# Patient Record
Sex: Male | Born: 1945 | Race: White | Hispanic: No | Marital: Married | Smoking: Former smoker
Health system: Southern US, Community
[De-identification: ages and names within clinical notes are randomized; demographics above are authoritative.]

## PROBLEM LIST (undated history)

## (undated) DIAGNOSIS — J069 Acute upper respiratory infection, unspecified: Secondary | ICD-10-CM

## (undated) DIAGNOSIS — K219 Gastro-esophageal reflux disease without esophagitis: Secondary | ICD-10-CM

## (undated) DIAGNOSIS — J45909 Unspecified asthma, uncomplicated: Secondary | ICD-10-CM

## (undated) HISTORY — DX: Acute upper respiratory infection, unspecified: J06.9

## (undated) HISTORY — DX: Unspecified asthma, uncomplicated: J45.909

## (undated) HISTORY — DX: Gastro-esophageal reflux disease without esophagitis: K21.9

## (undated) HISTORY — PX: TONSILLECTOMY: SUR1361

---

## 2001-12-26 ENCOUNTER — Encounter: Payer: Self-pay | Admitting: Emergency Medicine

## 2001-12-26 ENCOUNTER — Emergency Department (HOSPITAL_COMMUNITY): Admission: EM | Admit: 2001-12-26 | Discharge: 2001-12-26 | Payer: Self-pay | Admitting: Emergency Medicine

## 2003-08-16 ENCOUNTER — Emergency Department (HOSPITAL_COMMUNITY): Admission: EM | Admit: 2003-08-16 | Discharge: 2003-08-16 | Payer: Self-pay | Admitting: Emergency Medicine

## 2004-09-11 ENCOUNTER — Ambulatory Visit: Payer: Self-pay | Admitting: Internal Medicine

## 2005-01-29 ENCOUNTER — Ambulatory Visit: Payer: Self-pay | Admitting: Internal Medicine

## 2005-06-28 ENCOUNTER — Ambulatory Visit: Payer: Self-pay | Admitting: Internal Medicine

## 2006-02-06 ENCOUNTER — Ambulatory Visit: Payer: Self-pay | Admitting: Internal Medicine

## 2006-03-12 ENCOUNTER — Ambulatory Visit: Payer: Self-pay | Admitting: Internal Medicine

## 2006-05-11 ENCOUNTER — Emergency Department (HOSPITAL_COMMUNITY): Admission: EM | Admit: 2006-05-11 | Discharge: 2006-05-11 | Payer: Self-pay | Admitting: Emergency Medicine

## 2006-09-01 DIAGNOSIS — R05 Cough: Secondary | ICD-10-CM

## 2006-09-04 ENCOUNTER — Ambulatory Visit: Payer: Self-pay | Admitting: Family Medicine

## 2006-09-10 ENCOUNTER — Ambulatory Visit: Payer: Self-pay | Admitting: Internal Medicine

## 2006-09-10 DIAGNOSIS — J209 Acute bronchitis, unspecified: Secondary | ICD-10-CM

## 2006-09-19 ENCOUNTER — Telehealth: Payer: Self-pay | Admitting: Internal Medicine

## 2006-11-14 ENCOUNTER — Ambulatory Visit: Payer: Self-pay | Admitting: Internal Medicine

## 2006-11-14 DIAGNOSIS — M545 Low back pain: Secondary | ICD-10-CM

## 2006-11-14 DIAGNOSIS — J4489 Other specified chronic obstructive pulmonary disease: Secondary | ICD-10-CM | POA: Insufficient documentation

## 2006-11-14 DIAGNOSIS — M1990 Unspecified osteoarthritis, unspecified site: Secondary | ICD-10-CM | POA: Insufficient documentation

## 2006-11-14 DIAGNOSIS — J449 Chronic obstructive pulmonary disease, unspecified: Secondary | ICD-10-CM | POA: Insufficient documentation

## 2006-11-14 DIAGNOSIS — M542 Cervicalgia: Secondary | ICD-10-CM | POA: Insufficient documentation

## 2007-04-01 ENCOUNTER — Ambulatory Visit: Payer: Self-pay | Admitting: Family Medicine

## 2007-06-12 ENCOUNTER — Telehealth: Payer: Self-pay | Admitting: Internal Medicine

## 2007-06-13 ENCOUNTER — Telehealth: Payer: Self-pay | Admitting: Internal Medicine

## 2008-01-29 ENCOUNTER — Telehealth: Payer: Self-pay | Admitting: Internal Medicine

## 2013-09-11 ENCOUNTER — Encounter: Payer: Self-pay | Admitting: *Deleted

## 2015-05-24 LAB — PSA: PSA: 6.93

## 2015-12-06 ENCOUNTER — Other Ambulatory Visit: Payer: Self-pay | Admitting: Family Medicine

## 2015-12-06 ENCOUNTER — Ambulatory Visit
Admission: RE | Admit: 2015-12-06 | Discharge: 2015-12-06 | Disposition: A | Discharge status: Home / Self Care | Payer: Medicare Other | Source: Ambulatory Visit | Attending: Family Medicine | Admitting: Family Medicine

## 2015-12-06 DIAGNOSIS — M79605 Pain in left leg: Secondary | ICD-10-CM

## 2015-12-12 ENCOUNTER — Other Ambulatory Visit: Payer: Self-pay | Admitting: Family Medicine

## 2015-12-12 DIAGNOSIS — M79605 Pain in left leg: Secondary | ICD-10-CM

## 2015-12-12 DIAGNOSIS — T1590XA Foreign body on external eye, part unspecified, unspecified eye, initial encounter: Secondary | ICD-10-CM

## 2016-03-27 ENCOUNTER — Emergency Department (HOSPITAL_COMMUNITY): Payer: Medicare Other

## 2016-03-27 ENCOUNTER — Emergency Department (HOSPITAL_COMMUNITY)
Admission: EM | Admit: 2016-03-27 | Discharge: 2016-03-27 | Disposition: A | Discharge status: Home / Self Care | Payer: Medicare Other | Attending: Emergency Medicine | Admitting: Emergency Medicine

## 2016-03-27 ENCOUNTER — Encounter (HOSPITAL_COMMUNITY): Payer: Self-pay

## 2016-03-27 DIAGNOSIS — M79605 Pain in left leg: Secondary | ICD-10-CM | POA: Diagnosis present

## 2016-03-27 DIAGNOSIS — J449 Chronic obstructive pulmonary disease, unspecified: Secondary | ICD-10-CM | POA: Diagnosis not present

## 2016-03-27 DIAGNOSIS — Z87891 Personal history of nicotine dependence: Secondary | ICD-10-CM | POA: Insufficient documentation

## 2016-03-27 DIAGNOSIS — Z79899 Other long term (current) drug therapy: Secondary | ICD-10-CM | POA: Diagnosis not present

## 2016-03-27 DIAGNOSIS — M5432 Sciatica, left side: Secondary | ICD-10-CM | POA: Diagnosis not present

## 2016-03-27 MED ORDER — TAMSULOSIN HCL 0.4 MG PO CAPS: 0.4000 mg | ORAL_CAPSULE | Freq: Every day | ORAL | 0 refills | Status: AC

## 2016-03-27 MED ORDER — HYDROCODONE-ACETAMINOPHEN 5-325 MG PO TABS: 1.0000 | ORAL_TABLET | Freq: Four times a day (QID) | ORAL | 0 refills | Status: DC | PRN

## 2016-03-27 MED ORDER — HYDROCODONE-ACETAMINOPHEN 5-325 MG PO TABS
2.0000 | ORAL_TABLET | Freq: Once | ORAL | Status: AC
  Administered 2016-03-27: 2 via ORAL
  Filled 2016-03-27: qty 2

## 2016-03-27 MED ORDER — CYCLOBENZAPRINE HCL 10 MG PO TABS: 10.0000 mg | ORAL_TABLET | Freq: Three times a day (TID) | ORAL | 0 refills | Status: DC | PRN

## 2016-03-27 MED ORDER — CYCLOBENZAPRINE HCL 10 MG PO TABS
10.0000 mg | ORAL_TABLET | Freq: Once | ORAL | Status: AC
Start: 2016-03-27 — End: 2016-03-27
  Administered 2016-03-27: 10 mg via ORAL
  Filled 2016-03-27: qty 1

## 2016-03-27 MED ORDER — KETOROLAC TROMETHAMINE 60 MG/2ML IM SOLN
60.0000 mg | Freq: Once | INTRAMUSCULAR | Status: AC
  Administered 2016-03-27: 60 mg via INTRAMUSCULAR
  Filled 2016-03-27: qty 2

## 2016-03-27 MED ORDER — METHYLPREDNISOLONE 4 MG PO TBPK: ORAL_TABLET | ORAL | 0 refills | Status: DC

## 2016-03-27 NOTE — ED Triage Notes (Signed)
Patient c/o left hip and left leg pain ax 2 months. Patient c/o pain getting progressively worse. Patient denies any injury or swelling.

## 2016-03-27 NOTE — ED Provider Notes (Signed)
[facility]-EMERGENCY DEPT Provider Note   CSN: [id number] Arrival date & time: 03/27/16  1249     History   Chief Complaint Chief Complaint  Patient presents with  . Hip Pain  . Leg Pain    HPI Tyler Tanner is a 71 y.o. male.  HPI 71 year old male with past medical history of osteoarthritis here with left leg pain. The patient states that for the last several months, he has had progressively worsening aching, throbbing left gluteal and paraspinal pain. The pain occasionally increases a sharp, stabbing, stinging sensation that radiates down the back of his leg to the outside of his left foot. He has associated cramping of his muscles. No persistent numbness or weakness. No fevers or chills. No history of IV drug use. Denies any trauma. Has not had any loss of bowel or bladder function.  Past Medical History:  Diagnosis Date  . GERD (gastroesophageal reflux disease)     Patient Active Problem List   Diagnosis Date Noted  . COPD 11/14/2006  . OSTEOARTHRITIS 11/14/2006  . NECK PAIN 11/14/2006  . LOW BACK PAIN 11/14/2006  . 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 SYMPTOM, COUGH 09/01/2006    Past Surgical History:  Procedure Laterality Date  . TONSILLECTOMY         Home Medications    Prior to Admission medications   Medication Sig Start Date End Date Taking? Authorizing Provider  ibuprofen (ADVIL,MOTRIN) 800 MG tablet Take 800 mg by mouth every 8 (eight) hours as needed for moderate pain.  03/22/16  Yes Historical Provider, MD  cyclobenzaprine (FLEXERIL) 10 MG tablet Take 1 tablet (10 mg total) by mouth 3 (three) times daily as needed for muscle spasms. 03/27/16   Shaune Pollack, MD  HYDROcodone-acetaminophen (NORCO/VICODIN) 5-325 MG tablet Take 1-2 tablets by mouth every 6 (six) hours as needed for severe pain. 03/27/16   Shaune Pollack, MD  methylPREDNISolone (MEDROL DOSEPAK) 4 MG TBPK tablet Take as directed on pack 03/27/16   Shaune Pollack, MD  tamsulosin (FLOMAX) 0.4 MG CAPS  capsule Take 1 capsule (0.4 mg total) by mouth daily after supper. 03/27/16   Shaune Pollack, MD    Family History Family History  Problem Relation Age of Onset  . Diabetes Mother   . Coronary artery disease Brother     Social History Social History  Substance Use Topics  . Smoking status: Former Games developer  . Smokeless tobacco: Never Used  . Alcohol use No     Allergies   Patient has no known allergies.   Review of Systems Review of Systems  Constitutional: Negative for chills, fatigue and fever.  HENT: Negative for congestion and rhinorrhea.   Eyes: Negative for visual disturbance.  Respiratory: Negative for cough, shortness of breath and wheezing.   Cardiovascular: Negative for chest pain and leg swelling.  Gastrointestinal: Negative for abdominal pain, diarrhea, nausea and vomiting.  Genitourinary: Negative for dysuria and flank pain.  Musculoskeletal: Positive for back pain and gait problem (due to pain). Negative for neck pain and neck stiffness.  Skin: Negative for rash and wound.  Allergic/Immunologic: Negative for immunocompromised state.  Neurological: Positive for numbness (intermittent). Negative for syncope, weakness and headaches.  All other systems reviewed and are negative.    Physical Exam Updated Vital Signs BP (!) 146/96 (BP Location: Right Arm)   Pulse 73   Temp 98.4 F (36.9 C) (Oral)   Resp 16   Ht 5\' 6"  (1.676 m)   Wt 190 lb (86.2 kg)   SpO2 94%  BMI 30.67 kg/m   Physical Exam  Constitutional: He is oriented to person, place, and time. He appears well-developed and well-nourished. No distress.  HENT:  Head: Normocephalic and atraumatic.  Eyes: Conjunctivae are normal.  Neck: Neck supple.  Cardiovascular: Normal rate, regular rhythm and normal heart sounds.   Pulmonary/Chest: Effort normal. No respiratory distress. He has no wheezes.  Abdominal: He exhibits no distension.  Musculoskeletal: He exhibits no edema.  Neurological: He is  alert and oriented to person, place, and time. He exhibits normal muscle tone.  Skin: Skin is warm. Capillary refill takes less than 2 seconds. No rash noted.  Nursing note and vitals reviewed.   Spine Exam: Inspection/Palpation: Moderate TTP over left SI joint/paraspinal lumbar area, with no midline deformity or TTP. Positive straight leg test on left. Strength: 5/5 throughout LE bilaterally (hip flexion/extension, adduction/abduction; knee flexion/extension; foot dorsiflexion/plantarflexion, inversion/eversion; great toe inversion) Sensation: Intact to light touch in proximal and distal LE bilaterally Reflexes: 2+ quadriceps and achilles reflexes   ED Treatments / Results  Labs (all labs ordered are listed, but only abnormal results are displayed) Labs Reviewed - No data to display  EKG  EKG Interpretation None       Radiology Dg Lumbar Spine Complete  Result Date: 03/27/2016 CLINICAL DATA:  Low back pain. EXAM: LUMBAR SPINE - COMPLETE 4+ VIEW COMPARISON:  08/16/2003. FINDINGS: No acute bony abnormality identified. Normal alignment. Mild scoliosis. Mild degenerative change. IMPRESSION: Mild scoliosis lumbar spine. Mild diffuse degenerative change. No acute abnormality identified. Electronically Signed   By: Maisie Fus  Register   On: 03/27/2016 16:45    Procedures Procedures (including critical care time)  Medications Ordered in ED Medications  ketorolac (TORADOL) injection 60 mg (60 mg Intramuscular Given 03/27/16 1625)  HYDROcodone-acetaminophen (NORCO/VICODIN) 5-325 MG per tablet 2 tablet (2 tablets Oral Given 03/27/16 1625)  cyclobenzaprine (FLEXERIL) tablet 10 mg (10 mg Oral Given 03/27/16 1625)     Initial Impression / Assessment and Plan / ED Course  I have reviewed the triage vital signs and the nursing notes.  Pertinent labs & imaging results that were available during my care of the patient were reviewed by me and considered in my medical decision making (see chart  for details).   71 year old male with past mental history as above here with atraumatic left paraspinal pain radiating down his left leg. Exam is highly consistent with lumbar radiculopathy, specifically sciatica. His plain films are negative and he has no evidence of bony lesions. No recent trauma. No loss of bowel or bladder function or evidence to suggest cauda equina. He has no numbness or weakness on exam to suggest ongoing peripheral nerve damage. Will place him on a Medrol Dosepak, give analgesia, and outpatient follow-up. He does have a component of muscular spasm and will give him Flexeril. Of note, he has a history of BPH and was previously on Flomax and will give him 2 weeks of this while he is taking Flexeril to prevent urinary retention (as pt requested).   Final Clinical Impressions(s) / ED Diagnoses   Final diagnoses:  Sciatica of left side    New Prescriptions Discharge Medication List as of 03/27/2016  5:27 PM    START taking these medications   Details  cyclobenzaprine (FLEXERIL) 10 MG tablet Take 1 tablet (10 mg total) by mouth 3 (three) times daily as needed for muscle spasms., Starting Tue 03/27/2016, Print    HYDROcodone-acetaminophen (NORCO/VICODIN) 5-325 MG tablet Take 1-2 tablets by mouth every 6 (six) hours as  needed for severe pain., Starting Tue 03/27/2016, Print    methylPREDNISolone (MEDROL DOSEPAK) 4 MG TBPK tablet Take as directed on pack, Print    tamsulosin (FLOMAX) 0.4 MG CAPS capsule Take 1 capsule (0.4 mg total) by mouth daily after supper., Starting Tue 03/27/2016, Print         Shaune Pollackameron Zerrick Hanssen, MD 03/28/16 1316

## 2016-05-24 LAB — BASIC METABOLIC PANEL: Glucose: 86

## 2016-05-24 LAB — PSA: PSA: 5.9

## 2016-06-13 ENCOUNTER — Ambulatory Visit (INDEPENDENT_AMBULATORY_CARE_PROVIDER_SITE_OTHER): Payer: Medicare Other

## 2016-06-13 ENCOUNTER — Ambulatory Visit (INDEPENDENT_AMBULATORY_CARE_PROVIDER_SITE_OTHER): Payer: Medicare Other | Admitting: Physical Medicine and Rehabilitation

## 2016-06-13 ENCOUNTER — Encounter (INDEPENDENT_AMBULATORY_CARE_PROVIDER_SITE_OTHER): Payer: Self-pay | Admitting: Physical Medicine and Rehabilitation

## 2016-06-13 VITALS — BP 138/88

## 2016-06-13 DIAGNOSIS — G8929 Other chronic pain: Secondary | ICD-10-CM

## 2016-06-13 DIAGNOSIS — M7632 Iliotibial band syndrome, left leg: Secondary | ICD-10-CM | POA: Diagnosis not present

## 2016-06-13 DIAGNOSIS — M5416 Radiculopathy, lumbar region: Secondary | ICD-10-CM

## 2016-06-13 DIAGNOSIS — M25562 Pain in left knee: Secondary | ICD-10-CM | POA: Diagnosis not present

## 2016-06-13 NOTE — Progress Notes (Signed)
OTHER ATIENZA - 71 y.o. male MRN [id number]  Date of birth: 04-27-1945  Office Visit Note: Visit Date: 06/13/2016 PCP: Gordy Savers, MD Referred by: Gordy Savers, MD  Subjective: Chief Complaint  Patient presents with  . Lower Back - Pain   HPI: Mr. Hink is a 71 year old gentleman accompanied by his wife who provided some of the history. 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 He reports that around March he began having pain in the left low back that was shooting down the left leg. He has had back pain off and on before but nothing shooting down the leg like this. Does not do any specific injury. He did go to the emergency department at that point any received an injection of Toradol and likely Kenalog. He was given some Flexeril as well. He says the leg pain and back pain at that point actually stopped and the shot did extremely well for him. He is not having any real back or leg pain at this point. His biggest complaint coming in today is the fact that he saw Dr. Newell Coral at  [facility] who recommended surgery on his lumbar spine. He did have an MRI of the lumbar spine performed and this was requested by his primary care physician. This MRI is reviewed below and reviewed with the patient today. After the visit he really did not want surgery on his lumbar spine his biggest complaint at this point is really just lateral posterior left knee pain. He reports he gets really stiff with sitting and standing still in the knee. He denies any specific injury to the knee. He's never really known if she's had arthritis in his knees. He has not reported any swelling or locking or popping. He has not reported any strength loss. He has no numbness or tingling. He has no right-sided knee pain. Again the pain is really worse with him flexing the knee more than extending. He has the soreness feeling in the low back but really just not a big issue  at this point.    Review of Systems  Constitutional: Negative for chills, fever, malaise/fatigue and weight loss.  HENT: Negative for hearing loss and sinus pain.   Eyes: Negative for blurred vision, double vision and photophobia.  Respiratory: Negative for cough and shortness of breath.   Cardiovascular: Negative for chest pain, palpitations and leg swelling.  Gastrointestinal: Negative for abdominal pain, nausea and vomiting.  Genitourinary: Negative for flank pain.  Musculoskeletal: Positive for back pain and joint pain. Negative for myalgias.  Skin: Negative for itching and rash.  Neurological: Negative for tremors, focal weakness and weakness.  Endo/Heme/Allergies: Negative.   Psychiatric/Behavioral: Negative for depression.  All other systems reviewed and are negative.  Otherwise per HPI.  Assessment & Plan: Visit Diagnoses:  1. Lumbar radiculopathy   2. Chronic pain of left knee   3. It band syndrome, left     Plan: Findings:  Chronic worsening left knee pain with fairly benign the exam. 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 He has no swelling or other issues. I think at this point this is probably more of a combination of osteoporosis of the left knee as well as perhaps iliotibial band syndrome after the radiculitis radiculopathy episode. His lumbar spine MRI does show disc herniation at L5-S1 without compression. He also has some moderate stenosis at L3-4 which could also cause more of an L4 radicular pain.  But again there is no frank compression and no weakness on exam. I think the best approach right now is to continue with his ibuprofen and try to see if we can get him into physical therapy just for a couple of 3 sessions to look at the left knee and iliotibial band. If that doesn't seem to help them or if it's getting worse and I would have him seen by Dr. August Saucerean in our office to look at his knee. If his symptoms of radicular pain flareup and he can  obviously call us back and we'll look at. I don't think at this point he needs surgery at all.    Meds & Orders: No orders of the defined types were placed in this encounter.   Orders Placed This Encounter  Procedures  . XR Knee 1-2 Views Left  . Ambulatory referral to Physical Therapy    Follow-up: Return if symptoms worsen or fail to improve.   Procedures: No procedures performed  No notes on file   Clinical History: MRI LUMBAR SPINE WITHOUT CONTRAST  TECHNIQUE: Multiplanar, multisequence MR imaging of the lumbar spine was performed. No intravenous contrast was administered.  COMPARISON: None.  FINDINGS: Segmentation: Standard.  Alignment: Physiologic.  Vertebrae: No fracture, evidence of discitis, or bone lesion.  Conus medullaris: Extends to the T12 level and appears normal.  Paraspinal and other soft tissues: No paraspinal abnormality.  Disc levels:  Disc spaces: Degenerative disc disease with disc height loss at L5-S1.  T12-L1: No significant disc bulge. No evidence of neural foraminal stenosis. No central canal stenosis.  L1-L2: Mild broad-based disc bulge. No evidence of neural foraminal stenosis. No central canal stenosis.  L2-L3: Mild broad-based disc bulge. Mild bilateral facet arthropathy. No evidence of neural foraminal stenosis. No central canal stenosis.  L3-L4: Moderate broad-based disc bulge. Moderate bilateral facet arthropathy. Moderate spinal stenosis. No evidence of neural foraminal stenosis.  L4-L5: Mild broad-based disc bulge. Mild bilateral facet arthropathy. Mild bilateral lateral recess narrowing. No evidence of neural foraminal stenosis. No central canal stenosis.  L5-S1: Left paracentral disc protrusion with mass effect on the left intraspinal S1 nerve root. No evidence of neural foraminal stenosis. No central canal stenosis.  IMPRESSION: 1. At L5-S1 there is a left paracentral disc protrusion with mass effect on the left  intraspinal S1 nerve root. 2. At L4-5 there is a mild broad-based disc bulge with mild bilateral facet arthropathy. Mild bilateral lateral recess narrowing. 3. At L3-4 there is a moderate broad-based disc bulge. Moderate bilateral facet arthropathy. Moderate spinal stenosis.   Electronically Signed By: Elige KoHetal Patel On: 04/18/2016 10:58  He reports that he has quit smoking. He has never used smokeless tobacco. No results for input(s): HGBA1C, LABURIC in the last 8760 hours.  Objective:  VS:  HT:    WT:   BMI:     BP:138/88  HR: bpm  TEMP: ( )  RESP:  Physical Exam  Constitutional: He is oriented to person, place, and time. He appears well-developed and well-nourished. No distress.  HENT:  Head: Normocephalic and atraumatic.  Nose: Nose normal.  Mouth/Throat: Oropharynx is clear and moist.  Eyes: Conjunctivae are normal. Pupils are equal, round, and reactive to light.  Neck: Normal range of motion. Neck supple.  Cardiovascular: Regular rhythm and intact distal pulses.   Pulmonary/Chest: Effort normal and breath sounds normal.  Abdominal: Soft. He exhibits no distension.  Musculoskeletal: He exhibits no deformity.       Left knee: He exhibits no effusion.  Patient ambulates without aid with a fairly normal gait. He does have some pain with extension of the lumbar spine. He has no pain over the greater trochanters. He is tender along the insertion of the iliotibial band on the left as well as the hamstring area behind the knee. I do not appreciate any Baker's cyst. The exam falls below. He has good distal strength.  Neurological: He is alert and oriented to person, place, and time. No sensory deficit. Coordination normal.  Skin: Skin is warm. No rash noted.  Psychiatric: He has a normal mood and affect. His behavior is normal.  Nursing note and vitals reviewed.   Right Knee Exam  Right knee exam is normal.   Left Knee Exam   Range of Motion  The patient has normal left knee  ROM.  Muscle Strength   The patient has normal left knee strength.  Tests  McMurray:  Medial - negative Lateral - negative Lachman:  Anterior - negative     Varus: negative Valgus: negative Patellar Apprehension: negative  Other  Erythema: absent Scars: absent Sensation: normal Swelling: none Effusion: no effusion present     Imaging: Xr Knee 1-2 Views Left  Result Date: 06/14/2016 Symmetric bilateral medial joint line narrowing without effusion and no fractures or dislocations.   Past Medical/Family/Surgical/Social History: Medications & Allergies reviewed per EMR Patient Active Problem List   Diagnosis Date Noted  . COPD 11/14/2006  . OSTEOARTHRITIS 11/14/2006  . NECK PAIN 11/14/2006  . LOW BACK PAIN 11/14/2006  . 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 SYMPTOM, COUGH 09/01/2006   Past Medical History:  Diagnosis Date  . GERD (gastroesophageal reflux disease)    Family History  Problem Relation Age of Onset  . Diabetes Mother   . Coronary artery disease Brother    Past Surgical History:  Procedure Laterality Date  . TONSILLECTOMY     Social History   Occupational History  . Not on file.   Social History Main Topics  . Smoking status: Former Games developer  . Smokeless tobacco: Never Used  . Alcohol use No  . Drug use: No  . Sexual activity: Not on file

## 2016-06-13 NOTE — Progress Notes (Deleted)
Constant soreness behind left knee for several months. Walking helps. States it gets really stiff with sitting and standing still. Denies any low back pain or leg pain. Says when it first started which was around March pain was going down his leg. Had a shot in the ER and was put on Flexeril which has helped but still has the soreness feeling everyday but more prevalent at times. Saw a doctor at [facility] one time and didn't care for him. Unsure of his name but thinks it is Dr. Newell CoralNudelman.

## 2016-06-13 NOTE — Patient Instructions (Signed)
Iliotibial Band Syndrome Rehab  Ask your health care provider which exercises are safe for you. Do exercises exactly as told by your health care provider and adjust them as directed. It is normal to feel mild stretching, pulling, tightness, or discomfort as you do these exercises, but you should stop right away if you feel sudden pain or your pain gets worse. Do not begin these exercises until told by your health care provider.  Stretching and range of motion exercises  These exercises warm up your muscles and joints and improve the movement and flexibility of your hip and pelvis.  Exercise A: Quadriceps, prone    1. Lie on your abdomen on a firm surface, such as a bed or padded floor.  2. Bend your left / right knee and hold your ankle. If you cannot reach your ankle or pant leg, loop a belt around your foot and grab the belt instead.  3. Gently pull your heel toward your buttocks. Your knee should not slide out to the side. You should feel a stretch in the front of your thigh and knee.  4. Hold this position for __________ seconds.  Repeat __________ times. Complete this stretch __________ times a day.  Exercise B: Iliotibial band    1. Lie on your side with your left / right leg in the top position.  2. Bend both of your knees and grab your left / right ankle. Stretch out your bottom arm to help you balance.  3. Slowly bring your top knee back so your thigh goes behind your trunk.  4. Slowly lower your top leg toward the floor until you feel a gentle stretch on the outside of your left / right hip and thigh. If you do not feel a stretch and your knee will not fall farther, place the heel of your other foot on top of your knee and pull your knee down toward the floor with your foot.  5. Hold this position for __________ seconds.  Repeat __________ times. Complete this stretch __________ times a day.  Strengthening exercises  These exercises build strength and endurance in your hip and pelvis. Endurance is the  ability to use your muscles for a long time, even after they get tired.  Exercise C: Straight leg raises (  hip abductors)  1. Lie on your side with your left / right leg in the top position. Lie so your head, shoulder, knee, and hip line up. You may bend your bottom knee to help you balance.  2. Roll your hips slightly forward so your hips are stacked directly over each other and your left / right knee is facing forward.  3. Tense the muscles in your outer thigh and lift your top leg 4-6 inches (10-15 cm).  4. Hold this position for __________ seconds.  5. Slowly return to the starting position. Let your muscles relax completely before doing another repetition.  Repeat __________ times. Complete this exercise __________ times a day.  Exercise D: Straight leg raises (  hip extensors)  1. Lie on your abdomen on your bed or a firm surface. You can put a pillow under your hips if that is more comfortable.  2. Bend your left / right knee so your foot is straight up in the air.  3. Squeeze your buttock muscles and lift your left / right thigh off the bed. Do not let your back arch.  4. Tense this muscle as hard as you can without increasing any knee pain.    5. Hold this position for __________ seconds.  6. Slowly lower your leg to the starting position and allow it to relax completely.  Repeat __________ times. Complete this exercise __________ times a day.  Exercise E: Hip hike  1. Stand sideways on a bottom step. Stand on your left / right leg with your other foot unsupported next to the step. You can hold onto the railing or wall if needed for balance.  2. Keep your knees straight and your torso square. Then, lift your left / right hip up toward the ceiling.  3. Slowly let your left / right hip lower toward the floor, past the starting position. Your foot should get closer to the floor. Do not lean or bend your knees.  Repeat __________ times. Complete this exercise __________ times a day.  This information is not  intended to replace advice given to you by your health care provider. Make sure you discuss any questions you have with your health care provider.  Document Released: 12/18/2004 Document Revised: 08/23/2015 Document Reviewed: 11/19/2014  [employer]vier Interactive Patient Education © 2018 [employer]

## 2016-06-14 ENCOUNTER — Encounter (INDEPENDENT_AMBULATORY_CARE_PROVIDER_SITE_OTHER): Payer: Self-pay | Admitting: Physical Medicine and Rehabilitation

## 2016-07-02 ENCOUNTER — Ambulatory Visit (INDEPENDENT_AMBULATORY_CARE_PROVIDER_SITE_OTHER): Payer: Self-pay | Admitting: Specialist

## 2016-07-24 ENCOUNTER — Ambulatory Visit (INDEPENDENT_AMBULATORY_CARE_PROVIDER_SITE_OTHER): Payer: Medicare Other | Admitting: Physician Assistant

## 2016-07-24 ENCOUNTER — Encounter: Payer: Self-pay | Admitting: Physician Assistant

## 2016-07-24 VITALS — BP 143/77 | HR 77 | Temp 98.3°F | Resp 18 | Ht 65.98 in | Wt 194.4 lb

## 2016-07-24 DIAGNOSIS — I781 Nevus, non-neoplastic: Secondary | ICD-10-CM | POA: Diagnosis not present

## 2016-07-24 MED ORDER — SUPPORT COMPRESSION SOCK MENS MISC: 2.0000 [IU] | Freq: Every day | 1 refills | Status: DC

## 2016-07-24 NOTE — Patient Instructions (Addendum)
[facility] for compression stockings  Address: [address], [place], [place] [address]  Phone: [phone]   This is consistent with venous insuffiecieny. I recommend you wear compresion stockings daily while at work and then elevate your legs above your waist after work when you get home. I have also placed a referral to vascular surgery so they can evaluate your blood flow. If you develop any pain, coldness to the touch, numbness, swelling, or warmth please seek care immediately. Thank you for letting me participate in your health and well being.    IF you received an x-ray today, you will receive an invoice from [facility]. Please contact [facility] at [phone] with questions or concerns regarding your invoice.   IF you received labwork today, you will receive an invoice from [facility]. Please contact [facility] at [phone] with questions or concerns regarding your invoice.   Our billing staff will not be able to assist you with questions regarding bills from these companies.  You will be contacted with the lab results as soon as they are available. The fastest way to get your results is to activate your My Chart account. Instructions are located on the last page of this paperwork. If you have not heard from [place] regarding the results in 2 weeks, please contact this office.     Chronic Venous Insufficiency Chronic venous insufficiency, also called venous stasis, is a condition that prevents blood from being pumped effectively through the veins in your legs. Blood may no longer be pumped effectively from the legs back to the heart. This condition can range from mild to severe. With proper treatment, you should be able to continue with an active life. What are the causes? Chronic venous insufficiency occurs when the vein walls become stretched, weakened, or damaged, or when valves within the vein are damaged. Some common causes of this include:  High  blood pressure inside the veins (venous hypertension).  Increased blood pressure in the leg veins from long periods of sitting or standing.  A blood clot that blocks blood flow in a vein (deep vein thrombosis, DVT).  Inflammation of a vein (phlebitis) that causes a blood clot to form.  Tumors in the pelvis that cause blood to back up.  What increases the risk? The following factors may make you more likely to develop this condition:  Having a family history of this condition.  Obesity.  Pregnancy.  Living without enough physical activity or exercise (sedentary lifestyle).  Smoking.  Having a job that requires long periods of standing or sitting in one place.  Being a certain age. Women in their 3s and 18s and men in their 93s are more likely to develop this condition.  What are the signs or symptoms? Symptoms of this condition include:  Veins that are enlarged, bulging, or twisted (varicose veins).  Skin breakdown or ulcers.  Reddened or discolored skin on the front of the leg.  Brown, smooth, tight, and painful skin just above the ankle, usually on the inside of the leg (lipodermatosclerosis).  Swelling.  How is this diagnosed? This condition may be diagnosed based on:  Your medical history.  A physical exam.  Tests, such as: ? A procedure that creates an image of a blood vessel and nearby organs and provides information about blood flow through the blood vessel (duplex ultrasound). ? A procedure that tests blood flow (plethysmography). ? A procedure to look at the veins using X-ray and dye (venogram).  How is this treated?  The goals of treatment are to help you return to an active life and to minimize pain or disability. Treatment depends on the severity of your condition, and it may include:  Wearing compression stockings. These can help relieve symptoms and help prevent your condition from getting worse. However, they do not cure the  condition.  Sclerotherapy. This is a procedure involving an injection of a material that "dissolves" damaged veins.  Surgery. This may involve: ? Removing a diseased vein (vein stripping). ? Cutting off blood flow through the vein (laser ablation surgery). ? Repairing a valve.  Follow these instructions at home:  Wear compression stockings as told by your health care provider. These stockings help to prevent blood clots and reduce swelling in your legs.  Take over-the-counter and prescription medicines only as told by your health care provider.  Stay active by exercising, walking, or doing different activities. Ask your health care provider what activities are safe for you and how much exercise you need.  Drink enough fluid to keep your urine clear or pale yellow.  Do not use any products that contain nicotine or tobacco, such as cigarettes and e-cigarettes. If you need help quitting, ask your health care provider.  Keep all follow-up visits as told by your health care provider. This is important. Contact a health care provider if:  You have redness, swelling, or more pain in the affected area.  You see a red streak or line that extends up or down from the affected area.  You have skin breakdown or a loss of skin in the affected area, even if the breakdown is small.  You get an injury in the affected area. Get help right away if:  You get an injury and an open wound in the affected area.  You have severe pain that does not get better with medicine.  You have sudden numbness or weakness in the foot or ankle below the affected area, or you have trouble moving your foot or ankle.  You have a fever and you have worse or persistent symptoms.  You have chest pain.  You have shortness of breath. Summary  Chronic venous insufficiency, also called venous stasis, is a condition that prevents blood from being pumped effectively through the veins in your legs.  Chronic venous  insufficiency occurs when the vein walls become stretched, weakened, or damaged, or when valves within the vein are damaged.  Treatment for this condition depends on how severe your condition is, and it may involve wearing compression stockings or having a procedure.  Make sure you stay active by exercising, walking, or doing different activities. Ask your health care provider what activities are safe for you and how much exercise you need. This information is not intended to replace advice given to you by your health care provider. Make sure you discuss any questions you have with your health care provider. Document Released: 04/23/2006 Document Revised: 11/07/2015 Document Reviewed: 11/07/2015 Elsevier Interactive Patient Education  2017 [employer]

## 2016-07-24 NOTE — Progress Notes (Signed)
Tyler Tanner  MRN: [id number] DOB: 08/14/1945  Subjective:  Tyler Tanner is a 71 y.o. male seen in office today for a chief complaint of blue ankle on right foot x 2 days. 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 Notes for the past 2 days when he gets home he has noticed that his medial aspect of right ankle looks bluish/green. Denies acute injury, increase in activity, pain, coldness/warmth to the touch, paraesthesias, pulselessness, pallor, calf swelling, and claudication. States the discoloration resolves once he elevates his feet. When he wakes up in the morning, the discoloration is not present. Denies smoking. Has no hx of PAD. No other questions or concerns.  Review of Systems  Constitutional: Negative for chills, diaphoresis and fever.  Respiratory: Negative for shortness of breath.   Cardiovascular: Negative for chest pain, palpitations and leg swelling.  Gastrointestinal: Negative for nausea and vomiting.  Neurological: Negative for dizziness and light-headedness.    Patient Active Problem List   Diagnosis Date Noted  . COPD 11/14/2006  . OSTEOARTHRITIS 11/14/2006  . NECK PAIN 11/14/2006  . LOW BACK PAIN 11/14/2006  . 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 SYMPTOM, COUGH 09/01/2006    Current Outpatient Prescriptions on File Prior to Visit  Medication Sig Dispense Refill  . cyclobenzaprine (FLEXERIL) 10 MG tablet Take 1 tablet (10 mg total) by mouth 3 (three) times daily as needed for muscle spasms. 30 tablet 0  . ibuprofen (ADVIL,MOTRIN) 800 MG tablet Take 800 mg by mouth every 8 (eight) hours as needed for moderate pain.     . tamsulosin (FLOMAX) 0.4 MG CAPS capsule Take 1 capsule (0.4 mg total) by mouth daily after supper. 14 capsule 0  . HYDROcodone-acetaminophen (NORCO/VICODIN) 5-325 MG tablet Take 1-2 tablets by mouth every 6 (six) hours as needed for severe pain. (Patient not taking: Reported on 06/13/2016) 10 tablet 0  . methylPREDNISolone (MEDROL DOSEPAK) 4 MG  TBPK tablet Take as directed on pack (Patient not taking: Reported on 06/13/2016) 21 tablet 0   No current facility-administered medications on file prior to visit.     No Known Allergies    Social History   Social History  . Marital status: Married    Spouse name: N/A  . Number of children: N/A  . Years of education: N/A   Occupational History  . Not on file.   Social History Main Topics  . Smoking status: Former Smoker    Quit date: 07/25/1975  . Smokeless tobacco: Never Used  . Alcohol use No  . Drug use: No  . Sexual activity: Not on file   Other Topics Concern  . Not on file   Social History Narrative  . No narrative on file    Objective:  BP (!) 143/77 (BP Location: Right Arm, Patient Position: Sitting, Cuff Size: Normal)   Pulse 77   Temp 98.3 F (36.8 C) (Oral)   Resp 18   Ht 5' 5.98" (1.676 m)   Wt 194 lb 6.4 oz (88.2 kg)   SpO2 97%   BMI 31.39 kg/m   Physical Exam  Constitutional: He is oriented to person, place, and time and well-developed, well-nourished, and in no distress.  HENT:  Head: Normocephalic and atraumatic.  Eyes: Conjunctivae are normal.  Neck: Normal range of motion.  Cardiovascular: Normal rate, regular rhythm and normal heart sounds.   Pulses:      Dorsalis pedis pulses are 2+ on the right side, and 2+ on the left side.  Posterior tibial pulses are 2+ on the right side, and 2+ on the left side.  Pulmonary/Chest: Effort normal.  Musculoskeletal:       Right ankle: Normal. He exhibits normal range of motion, no swelling, no ecchymosis and normal pulse. No tenderness.       Left ankle: Normal. He exhibits normal range of motion, no swelling, no ecchymosis and normal pulse. No tenderness.       Right lower leg: He exhibits no tenderness and no swelling.       Left lower leg: He exhibits no tenderness and no swelling.       Right foot: Normal. There is normal range of motion, no tenderness, no swelling and normal capillary refill.         Left foot: There is normal range of motion, no tenderness, no swelling and normal capillary refill.  Neurological: He is alert and oriented to person, place, and time. Gait normal.  Skin: Skin is warm and dry.  Telangectasia noted inferior to medial malleolus bilaterally.   Few varicose veins noted bilaterally.   No poikilothermia or erythema noted on exam of feet bilaterally.     Psychiatric: Affect normal.  Vitals reviewed.   Assessment and Plan :  1. Telangiectasia No acute findings on exam.  Hx consistent with chronic venous insufficiency. Recommend compression stockings during the day and elevating feet at night. Given referral to vascular surgery for further evaluation.  - Ambulatory referral to Vascular Surgery - Elastic Bandages & Supports (SUPPORT COMPRESSION SOCK MENS) MISC; 2 Units by Does not apply route daily.  Dispense: 2 each; Refill: 1  Benjiman Core, PA-C  [facility] at [facility] 07/25/2016 6:50 PM

## 2016-10-23 ENCOUNTER — Encounter: Payer: Self-pay | Admitting: Vascular Surgery

## 2017-01-27 ENCOUNTER — Emergency Department (HOSPITAL_COMMUNITY): Payer: Medicare Other

## 2017-01-27 ENCOUNTER — Emergency Department (HOSPITAL_COMMUNITY)
Admission: EM | Admit: 2017-01-27 | Discharge: 2017-01-27 | Disposition: A | Discharge status: Home / Self Care | Payer: Medicare Other | Attending: Emergency Medicine | Admitting: Emergency Medicine

## 2017-01-27 ENCOUNTER — Encounter (HOSPITAL_COMMUNITY): Payer: Self-pay | Admitting: Emergency Medicine

## 2017-01-27 DIAGNOSIS — Z79899 Other long term (current) drug therapy: Secondary | ICD-10-CM | POA: Insufficient documentation

## 2017-01-27 DIAGNOSIS — R059 Cough, unspecified: Secondary | ICD-10-CM

## 2017-01-27 DIAGNOSIS — Z87891 Personal history of nicotine dependence: Secondary | ICD-10-CM | POA: Diagnosis not present

## 2017-01-27 DIAGNOSIS — R05 Cough: Secondary | ICD-10-CM

## 2017-01-27 DIAGNOSIS — R0602 Shortness of breath: Secondary | ICD-10-CM | POA: Insufficient documentation

## 2017-01-27 DIAGNOSIS — J449 Chronic obstructive pulmonary disease, unspecified: Secondary | ICD-10-CM | POA: Diagnosis not present

## 2017-01-27 LAB — CBC WITH DIFFERENTIAL/PLATELET
BASOS PCT: 1 %
Basophils Absolute: 0 10*3/uL (ref 0.0–0.1)
EOS PCT: 2 %
Eosinophils Absolute: 0.1 10*3/uL (ref 0.0–0.7)
HEMATOCRIT: 50.2 % (ref 39.0–52.0)
Hemoglobin: 16.8 g/dL (ref 13.0–17.0)
Lymphocytes Relative: 41 %
Lymphs Abs: 2.7 10*3/uL (ref 0.7–4.0)
MCH: 30.7 pg (ref 26.0–34.0)
MCHC: 33.5 g/dL (ref 30.0–36.0)
MCV: 91.6 fL (ref 78.0–100.0)
MONO ABS: 0.6 10*3/uL (ref 0.1–1.0)
MONOS PCT: 10 %
NEUTROS ABS: 3.1 10*3/uL (ref 1.7–7.7)
Neutrophils Relative %: 46 %
Platelets: 192 10*3/uL (ref 150–400)
RBC: 5.48 MIL/uL (ref 4.22–5.81)
RDW: 13 % (ref 11.5–15.5)
WBC: 6.6 10*3/uL (ref 4.0–10.5)

## 2017-01-27 LAB — BASIC METABOLIC PANEL
Anion gap: 7 (ref 5–15)
BUN: 19 mg/dL (ref 6–20)
CALCIUM: 9 mg/dL (ref 8.9–10.3)
CO2: 27 mmol/L (ref 22–32)
CREATININE: 1.21 mg/dL (ref 0.61–1.24)
Chloride: 105 mmol/L (ref 101–111)
GFR calc non Af Amer: 59 mL/min — ABNORMAL LOW (ref >60)
GLUCOSE: 125 mg/dL — AB (ref 65–99)
Potassium: 4 mmol/L (ref 3.5–5.1)
Sodium: 139 mmol/L (ref 135–145)

## 2017-01-27 MED ORDER — ALBUTEROL SULFATE (2.5 MG/3ML) 0.083% IN NEBU
5.0000 mg | INHALATION_SOLUTION | Freq: Once | RESPIRATORY_TRACT | Status: AC
  Administered 2017-01-27: 5 mg via RESPIRATORY_TRACT
  Filled 2017-01-27: qty 6

## 2017-01-27 MED ORDER — IPRATROPIUM BROMIDE 0.02 % IN SOLN
0.5000 mg | Freq: Once | RESPIRATORY_TRACT | Status: AC
  Administered 2017-01-27: 0.5 mg via RESPIRATORY_TRACT
  Filled 2017-01-27: qty 2.5

## 2017-01-27 MED ORDER — SALINE SPRAY 0.65 % NA SOLN: 1.0000 | NASAL | 0 refills | Status: DC | PRN

## 2017-01-27 MED ORDER — PROMETHAZINE-DM 6.25-15 MG/5ML PO SYRP: 5.0000 mL | ORAL_SOLUTION | Freq: Four times a day (QID) | ORAL | 0 refills | Status: DC | PRN

## 2017-01-27 MED ORDER — ALBUTEROL SULFATE HFA 108 (90 BASE) MCG/ACT IN AERS
2.0000 | INHALATION_SPRAY | RESPIRATORY_TRACT | Status: DC | PRN
  Administered 2017-01-27: 2 via RESPIRATORY_TRACT
  Filled 2017-01-27: qty 6.7

## 2017-01-27 NOTE — ED Triage Notes (Signed)
Pt reports 1 month hx of cough and shortness of breath. Tx by PCP with cough medicine and inhaler 3 weeks ago. C/o tightness in chest x 1 month. Frequent dry cough noted at , no wheezing. Pt is alert, ambulatory and appropriate . Wife at bedside

## 2017-01-27 NOTE — ED Triage Notes (Signed)
Pt stated that he does not want contrast dye in his body. PA advised

## 2017-01-27 NOTE — ED Notes (Signed)
Bed: WTR5 Expected date:  Expected time:  Means of arrival:  Comments: 

## 2017-01-27 NOTE — ED Provider Notes (Signed)
[facility]-EMERGENCY DEPT Provider Note   CSN: [id number] Arrival date & time: 01/27/17  1022     History   Chief Complaint Chief Complaint  Patient presents with  . Cough  . Shortness of Breath    HPI Tyler Tanner is a 72 y.o. male with past medical history significant for COPD presenting with 1 month of cough and shortness of breath with congestion.  He explains that he was seen by his PCP a few weeks ago and sent home with inhaler.  He called the office and they started him on antibiotics but he is unsure of what it was and a cough medicine that significantly improved his symptoms.  Completed his course of antibiotics and cough medicine approximately a week ago turn worsening cough and feeling short of breath.  Reports that his inhaler is almost out but he cannot get a refill for his inhaler or cough medicine. Denies any fever, chills, nausea, vomiting, chest pain.  HPI  Past Medical History:  Diagnosis Date  . GERD (gastroesophageal reflux disease)     Patient Active Problem List   Diagnosis Date Noted  . COPD 11/14/2006  . OSTEOARTHRITIS 11/14/2006  . NECK PAIN 11/14/2006  . LOW BACK PAIN 11/14/2006  . 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 SYMPTOM, COUGH 09/01/2006    Past Surgical History:  Procedure Laterality Date  . TONSILLECTOMY         Home Medications    Prior to Admission medications   Medication Sig Start Date End Date Taking? Authorizing Provider  cyclobenzaprine (FLEXERIL) 10 MG tablet Take 1 tablet (10 mg total) by mouth 3 (three) times daily as needed for muscle spasms. 03/27/16   Shaune Pollack, MD  Elastic Bandages & Supports (SUPPORT COMPRESSION SOCK MENS) MISC 2 Units by Does not apply route daily. 07/24/16   Benjiman Core D, PA-C  HYDROcodone-acetaminophen (NORCO/VICODIN) 5-325 MG tablet Take 1-2 tablets by mouth every 6 (six) hours as needed for severe pain. Patient not taking: Reported on 06/13/2016 03/27/16   Shaune Pollack, MD  ibuprofen (ADVIL,MOTRIN) 800 MG tablet Take 800 mg by mouth every 8 (eight) hours as needed for moderate pain.  03/22/16   [provider]  methylPREDNISolone (MEDROL DOSEPAK) 4 MG TBPK tablet Take as directed on pack Patient not taking: Reported on 06/13/2016 03/27/16   Shaune Pollack, MD  promethazine-dextromethorphan (PROMETHAZINE-DM) 6.25-15 MG/5ML syrup Take 5 mLs by mouth 4 (four) times daily as needed for cough. 01/27/17   Georgiana Shore, PA-C  tamsulosin (FLOMAX) 0.4 MG CAPS capsule Take 1 capsule (0.4 mg total) by mouth daily after supper. 03/27/16   Shaune Pollack, MD    Family History Family History  Problem Relation Age of Onset  . Diabetes Mother   . Coronary artery disease Brother     Social History Social History   Tobacco Use  . Smoking status: Former Smoker    Last attempt to quit: 07/25/1975    Years since quitting: 41.5  . Smokeless tobacco: Never Used  Substance Use Topics  . Alcohol use: No  . Drug use: No     Allergies   Patient has no known allergies.   Review of Systems Review of Systems  Constitutional: Negative for chills, diaphoresis and fever.  HENT: Positive for congestion and sinus pressure. Negative for ear pain, sinus pain, tinnitus, trouble swallowing and voice change.   Eyes: Negative for photophobia, pain, redness and visual disturbance.  Respiratory: Positive for cough, chest tightness and shortness of  breath. Negative for choking, wheezing and stridor.   Cardiovascular: Negative for chest pain, palpitations and leg swelling.  Gastrointestinal: Negative for abdominal pain, nausea and vomiting.  Genitourinary: Negative for dysuria.  Musculoskeletal: Negative for myalgias.  Skin: Negative for color change, pallor and rash.  Neurological: Negative for dizziness, light-headedness and headaches.     Physical Exam Updated Vital Signs BP 129/80 (BP Location: Left Arm)   Pulse 80   Temp 97.9 F (36.6 C) (Oral)    Resp 20   Wt 90.7 kg (200 lb)   SpO2 95%   BMI 32.30 kg/m   Physical Exam  Constitutional: He is oriented to person, place, and time. He appears well-developed and well-nourished.  Non-toxic appearance. He does not appear ill. No distress.  Afebrile, nontoxic-appearing, sitting comfortably in chair in no acute distress.  He is speaking in full sentences but taking shallow breaths.  HENT:  Head: Normocephalic and atraumatic.  Mouth/Throat: Oropharynx is clear and moist. No oropharyngeal exudate or posterior oropharyngeal edema.  Eyes: Conjunctivae are normal.  Neck: Neck supple.  Cardiovascular: Normal rate and regular rhythm.  Pulmonary/Chest: Effort normal. No accessory muscle usage. No tachypnea. No respiratory distress. He has decreased breath sounds in the left lower field. He has no wheezes. He has no rhonchi. He has no rales.  Patient has decreased lung sounds at the base on the left.  Abdominal: He exhibits no distension.  Musculoskeletal: He exhibits no edema.  Neurological: He is alert and oriented to person, place, and time.  Skin: Skin is warm and dry. He is not diaphoretic. No erythema. No pallor.  Psychiatric: He has a normal mood and affect.  Nursing note and vitals reviewed.    ED Treatments / Results  Labs (all labs ordered are listed, but only abnormal results are displayed) Labs Reviewed  BASIC METABOLIC PANEL - Abnormal; Notable for the following components:      Result Value   Glucose, Bld 125 (*)    GFR calc non Af Amer 59 (*)    All other components within normal limits  CBC WITH DIFFERENTIAL/PLATELET    EKG  EKG Interpretation  Date/Time:  Sunday January 27 2017 12:09:31 EST Ventricular Rate:  78 PR Interval:    QRS Duration: 97 QT Interval:  369 QTC Calculation: 421 R Axis:   27 Text Interpretation:  Sinus rhythm Abnormal R-wave progression, early transition Baseline wander in lead(s) V4 V6 Confirmed by Lorre NickAllen, Anthony ([id number]) on 01/27/2017  3:32:16 PM       Radiology Dg Chest 2 View  Result Date: 01/27/2017 CLINICAL DATA:  1 month history of coughing congestion. EXAM: CHEST  2 VIEW COMPARISON:  09/04/2006 FINDINGS: Asymmetric elevation left hemidiaphragm with left base atelectasis. Prominent gastric bubble noted right lung clear. The cardiopericardial silhouette is within normal limits for size. The visualized bony structures of the thorax are intact. Telemetry leads overlie the chest. IMPRESSION: Interval development of marked asymmetric elevation left hemidiaphragm with associated atelectasis. Electronically Signed   By: Kennith CenterEric  Mansell M.D.   On: 01/27/2017 12:52   Ct Chest Wo Contrast  Result Date: 01/27/2017 CLINICAL DATA:  Cough and shortness of breath for 1 month, elevated LEFT hemidiaphragm, history of GERD EXAM: CT CHEST WITHOUT CONTRAST TECHNIQUE: Multidetector CT imaging of the chest was performed following the standard protocol without IV contrast. Sagittal and coronal MPR images reconstructed from axial data set. COMPARISON:  None; correlation chest radiograph 01/27/2017 FINDINGS: Cardiovascular: Atherosclerotic calcifications aorta without aneurysm. Minimal coronary arterial calcification. No  pericardial effusion. Mediastinum/Nodes: Base of cervical region unremarkable. No thoracic adenopathy. Esophagus unremarkable. Mildly prominent fat at inferior mediastinum question herniation through esophageal hiatus. Lungs/Pleura: Scarring at medial aspect of RIGHT lower lobe. Subsegmental atelectasis at base of LEFT lower lobe. Remaining lungs clear. No pulmonary infiltrate, pleural effusion, or pneumothorax. Upper Abdomen: Cholelithiasis. Remaining visualized upper abdomen unremarkable. Musculoskeletal: Degenerative disc disease changes at visualized inferior cervical spine. Lytic lesion within manubrium LEFT of midline, well-defined, generally benign in appearance question hemangioma. No other bone lesions. IMPRESSION: Medial RIGHT  lower lobe scarring with subsegmental atelectasis at LEFT base. Lungs otherwise unremarkable. Cholelithiasis. Aortic Atherosclerosis (ICD10-I70.0). Electronically Signed   By: Ulyses Southward M.D.   On: 01/27/2017 15:50    Procedures Procedures (including critical care time)  Medications Ordered in ED Medications  albuterol (PROVENTIL HFA;VENTOLIN HFA) 108 (90 Base) MCG/ACT inhaler 2 puff (2 puffs Inhalation Given 01/27/17 1626)  albuterol (PROVENTIL) (2.5 MG/3ML) 0.083% nebulizer solution 5 mg (5 mg Nebulization Given 01/27/17 1159)  albuterol (PROVENTIL) (2.5 MG/3ML) 0.083% nebulizer solution 5 mg (5 mg Nebulization Given 01/27/17 1402)  ipratropium (ATROVENT) nebulizer solution 0.5 mg (0.5 mg Nebulization Given 01/27/17 1407)     Initial Impression / Assessment and Plan / ED Course  I have reviewed the triage vital signs and the nursing notes.  Pertinent labs & imaging results that were available during my care of the patient were reviewed by me and considered in my medical decision making (see chart for details).    Patient with history of COPD who quit smoking more than 35 years ago presenting with 1 month of chronic cough and shortness of breath.  Has been seen by his PCP prior and prescribed antibiotics although he states that his PCP is at [facility], there are no notes or prescriptions on epic.  No chest x-ray recently.  Decreased lung sounds in the left lower field. O2 sats 97% on room air.  Patient reported improvement after nebulizing treatment.  He still states that he feels short of breath.  Chest x-ray today showing an elevated left hemidiaphragm and atelectasis.  Will obtain labs and administer another nebulizing treatment and reassess  Patient refused any contrast for CT. Ct chest showing subsegmental left atelectasis and right lower base scarring.  Labs unremarkable. EKG unremarkable, sinus rhythm  Patient was attempting to leave before the CT results are back. I was able to  keep patient in the room and discuss results and plan.  On second reassessment, he reported significant improvement in his breathing from the nebulizing treatment.  Was provided with an inhaler in the emergency department to take home.  Patient was well-appearing, nontoxic afebrile hemodynamically stable and ready for discharge.  Will discharge home with symptomatic relief and close follow-up with PCP.  Discussed strict return precautions and advised to return to the emergency department if experiencing any new or worsening symptoms. Instructions were understood and patient agreed with discharge plan. Final Clinical Impressions(s) / ED Diagnoses   Final diagnoses:  Cough  Shortness of breath    ED Discharge Orders        Ordered    promethazine-dextromethorphan (PROMETHAZINE-DM) 6.25-15 MG/5ML syrup  4 times daily PRN     01/27/17 1622       Gregary Cromer 01/27/17 1630    Lorre Nick, MD 01/28/17 [phone]

## 2017-01-27 NOTE — Discharge Instructions (Signed)
As discussed, make sure that you stay well-hydrated and take cough medicine as needed.  Do not drive or operate machinery while you take this medication. Use your inhaler as needed.  Follow up with your primary care provider as soon as possible. Return the emergency department if you experience chest pain, difficulty breathing, worsening in anyway or any other new concerning symptoms in the meantime.

## 2017-02-07 ENCOUNTER — Ambulatory Visit (INDEPENDENT_AMBULATORY_CARE_PROVIDER_SITE_OTHER): Payer: Medicare Other | Admitting: Family Medicine

## 2017-02-07 ENCOUNTER — Encounter: Payer: Self-pay | Admitting: Family Medicine

## 2017-02-07 VITALS — BP 120/78 | HR 84 | Temp 98.2°F | Ht 65.98 in | Wt 201.0 lb

## 2017-02-07 DIAGNOSIS — R0981 Nasal congestion: Secondary | ICD-10-CM

## 2017-02-07 DIAGNOSIS — J310 Chronic rhinitis: Secondary | ICD-10-CM

## 2017-02-07 DIAGNOSIS — J4541 Moderate persistent asthma with (acute) exacerbation: Secondary | ICD-10-CM

## 2017-02-07 DIAGNOSIS — T485X5A Adverse effect of other anti-common-cold drugs, initial encounter: Secondary | ICD-10-CM

## 2017-02-07 DIAGNOSIS — J45909 Unspecified asthma, uncomplicated: Secondary | ICD-10-CM | POA: Insufficient documentation

## 2017-02-07 DIAGNOSIS — T485X1A Poisoning by other anti-common-cold drugs, accidental (unintentional), initial encounter: Secondary | ICD-10-CM

## 2017-02-07 LAB — CBC
HCT: 48.2 % (ref 39.0–52.0)
HEMOGLOBIN: 16.3 g/dL (ref 13.0–17.0)
MCHC: 33.9 g/dL (ref 30.0–36.0)
MCV: 91 fl (ref 78.0–100.0)
PLATELETS: 197 10*3/uL (ref 150.0–400.0)
RBC: 5.29 Mil/uL (ref 4.22–5.81)
RDW: 13.1 % (ref 11.5–15.5)
WBC: 6.6 10*3/uL (ref 4.0–10.5)

## 2017-02-07 MED ORDER — PREDNISONE 20 MG PO TABS: 20.0000 mg | ORAL_TABLET | Freq: Two times a day (BID) | ORAL | 0 refills | Status: DC

## 2017-02-07 MED ORDER — METHYLPREDNISOLONE SODIUM SUCC 125 MG IJ SOLR
125.0000 mg | Freq: Once | INTRAMUSCULAR | Status: AC
  Administered 2017-02-07: 125 mg via INTRAMUSCULAR

## 2017-02-07 MED ORDER — FLUTICASONE PROPIONATE 50 MCG/ACT NA SUSP: 2.0000 | Freq: Every day | NASAL | 6 refills | Status: AC

## 2017-02-07 NOTE — Progress Notes (Signed)
Subjective:  Patient ID: Tyler Tanner, male    DOB: November 01, 1945  Age: 72 y.o. MRN: [id number]  CC: Establish Care   HPI Tyler Tanner presents for patient reports an ongoing history of respiratory tract symptoms over the last month.  He has taken multiple over-the-counter medicines including an antibiotic.  Seen in the emergency room on the 27th  Last month.  Chest x-ray and CT scan were negative for acute infection.  He responded favorably to nebulizers and was discharged with a metered-dose inhaler.  He continues to be afebrile with nasal congestion, clear rhinorrhea cough with wheezing also productive of scant clear phlegm.  There is been no fever or purulent phlegm or rhinorrhea.  He has had no facial pressure or teeth pain.  Questionable history of COPD.  He quit smoking 40 years ago he tells me.  He has been using a nasal decongestant for 2 weeks now.    Outpatient Medications Prior to Visit  Medication Sig Dispense Refill  . ibuprofen (ADVIL,MOTRIN) 800 MG tablet Take 800 mg by mouth every 8 (eight) hours as needed for moderate pain.     . tamsulosin (FLOMAX) 0.4 MG CAPS capsule Take 1 capsule (0.4 mg total) by mouth daily after supper. 14 capsule 0  . cyclobenzaprine (FLEXERIL) 10 MG tablet Take 1 tablet (10 mg total) by mouth 3 (three) times daily as needed for muscle spasms. 30 tablet 0  . Elastic Bandages & Supports (SUPPORT COMPRESSION SOCK MENS) MISC 2 Units by Does not apply route daily. 2 each 1  . HYDROcodone-acetaminophen (NORCO/VICODIN) 5-325 MG tablet Take 1-2 tablets by mouth every 6 (six) hours as needed for severe pain. (Patient not taking: Reported on 06/13/2016) 10 tablet 0  . methylPREDNISolone (MEDROL DOSEPAK) 4 MG TBPK tablet Take as directed on pack (Patient not taking: Reported on 06/13/2016) 21 tablet 0  . promethazine-dextromethorphan (PROMETHAZINE-DM) 6.25-15 MG/5ML syrup Take 5 mLs by mouth 4 (four) times daily as needed for cough. 118 mL 0  . sodium chloride  (OCEAN) 0.65 % SOLN nasal spray Place 1 spray into both nostrils as needed for congestion. 104 mL 0   No facility-administered medications prior to visit.     ROS Review of Systems  Constitutional: Negative for chills, fatigue, fever and unexpected weight change.  HENT: Positive for congestion, postnasal drip and rhinorrhea. Negative for sinus pressure, sinus pain, sore throat, tinnitus and trouble swallowing.   Eyes: Negative for photophobia and visual disturbance.  Respiratory: Positive for cough, shortness of breath and wheezing. Negative for chest tightness.   Cardiovascular: Negative for chest pain, palpitations and leg swelling.  Gastrointestinal: Negative.   Hematological: Does not bruise/bleed easily.  Psychiatric/Behavioral: Negative.     Objective:  BP 120/78 (BP Location: Left Arm, Patient Position: Sitting, Cuff Size: Normal)   Pulse 84   Temp 98.2 F (36.8 C) (Oral)   Ht 5' 5.98" (1.676 m)   Wt 201 lb (91.2 kg)   SpO2 94%   BMI 32.46 kg/m   BP Readings from Last 3 Encounters:  02/07/17 120/78  01/27/17 129/80  07/24/16 (!) 143/77    Wt Readings from Last 3 Encounters:  02/07/17 201 lb (91.2 kg)  01/27/17 200 lb (90.7 kg)  07/24/16 194 lb 6.4 oz (88.2 kg)    Physical Exam  Constitutional: He is oriented to person, place, and time. He appears well-developed and well-nourished. No distress.  HENT:  Head: Normocephalic and atraumatic.  Right Ear: External ear normal.  Left Ear:  External ear normal.  Mouth/Throat: Oropharynx is clear and moist. No oropharyngeal exudate.  Eyes: Conjunctivae are normal. Pupils are equal, round, and reactive to light. Right eye exhibits no discharge. Left eye exhibits no discharge. No scleral icterus.  Neck: Neck supple. No JVD present. No tracheal deviation present. No thyromegaly present.  Cardiovascular: Normal rate, regular rhythm and normal heart sounds.  Pulmonary/Chest: No stridor. No respiratory distress. He has  decreased breath sounds. He has no wheezes. He has no rhonchi. He has no rales.  Abdominal: Bowel sounds are normal.  Lymphadenopathy:    He has no cervical adenopathy.  Neurological: He is alert and oriented to person, place, and time.  Skin: Skin is warm and dry. He is not diaphoretic.  Psychiatric: He has a normal mood and affect. His behavior is normal.    Lab Results  Component Value Date   WBC 6.6 01/27/2017   HGB 16.8 01/27/2017   HCT 50.2 01/27/2017   PLT 192 01/27/2017   GLUCOSE 125 (H) 01/27/2017   NA 139 01/27/2017   K 4.0 01/27/2017   CL 105 01/27/2017   CREATININE 1.21 01/27/2017   BUN 19 01/27/2017   CO2 27 01/27/2017    Dg Chest 2 View  Result Date: 01/27/2017 CLINICAL DATA:  1 month history of coughing congestion. EXAM: CHEST  2 VIEW COMPARISON:  09/04/2006 FINDINGS: Asymmetric elevation left hemidiaphragm with left base atelectasis. Prominent gastric bubble noted right lung clear. The cardiopericardial silhouette is within normal limits for size. The visualized bony structures of the thorax are intact. Telemetry leads overlie the chest. IMPRESSION: Interval development of marked asymmetric elevation left hemidiaphragm with associated atelectasis. Electronically Signed   By: Kennith CenterEric  Mansell M.D.   On: 01/27/2017 12:52   Ct Chest Wo Contrast  Result Date: 01/27/2017 CLINICAL DATA:  Cough and shortness of breath for 1 month, elevated LEFT hemidiaphragm, history of GERD EXAM: CT CHEST WITHOUT CONTRAST TECHNIQUE: Multidetector CT imaging of the chest was performed following the standard protocol without IV contrast. Sagittal and coronal MPR images reconstructed from axial data set. COMPARISON:  None; correlation chest radiograph 01/27/2017 FINDINGS: Cardiovascular: Atherosclerotic calcifications aorta without aneurysm. Minimal coronary arterial calcification. No pericardial effusion. Mediastinum/Nodes: Base of cervical region unremarkable. No thoracic adenopathy. Esophagus  unremarkable. Mildly prominent fat at inferior mediastinum question herniation through esophageal hiatus. Lungs/Pleura: Scarring at medial aspect of RIGHT lower lobe. Subsegmental atelectasis at base of LEFT lower lobe. Remaining lungs clear. No pulmonary infiltrate, pleural effusion, or pneumothorax. Upper Abdomen: Cholelithiasis. Remaining visualized upper abdomen unremarkable. Musculoskeletal: Degenerative disc disease changes at visualized inferior cervical spine. Lytic lesion within manubrium LEFT of midline, well-defined, generally benign in appearance question hemangioma. No other bone lesions. IMPRESSION: Medial RIGHT lower lobe scarring with subsegmental atelectasis at LEFT base. Lungs otherwise unremarkable. Cholelithiasis. Aortic Atherosclerosis (ICD10-I70.0). Electronically Signed   By: Ulyses SouthwardMark  Boles M.D.   On: 01/27/2017 15:50    Assessment & Plan:   Tyler Tanner was seen today for establish care.  Diagnoses and all orders for this visit:  Moderate persistent reactive airway disease with acute exacerbation -     CBC -     predniSONE (DELTASONE) 20 MG tablet; Take 1 tablet (20 mg total) by mouth 2 (two) times daily with a meal for 7 days. -     methylPREDNISolone sodium succinate (SOLU-MEDROL) 125 mg/2 mL injection 125 mg  Nasal congestion due to prolonged use of decongestants -     CBC -     fluticasone (FLONASE) 50  MCG/ACT nasal spray; Place 2 sprays into both nostrils daily. -     predniSONE (DELTASONE) 20 MG tablet; Take 1 tablet (20 mg total) by mouth 2 (two) times daily with a meal for 7 days. -     methylPREDNISolone sodium succinate (SOLU-MEDROL) 125 mg/2 mL injection 125 mg   I have discontinued Deion E. Kauth's methylPREDNISolone, HYDROcodone-acetaminophen, cyclobenzaprine, SUPPORT COMPRESSION SOCK MENS, promethazine-dextromethorphan, and sodium chloride. I am also having him start on fluticasone and predniSONE. Additionally, I am having him maintain his ibuprofen and  tamsulosin. We administered methylPREDNISolone sodium succinate.  Meds ordered this encounter  Medications  . fluticasone (FLONASE) 50 MCG/ACT nasal spray    Sig: Place 2 sprays into both nostrils daily.    Dispense:  16 g    Refill:  6  . predniSONE (DELTASONE) 20 MG tablet    Sig: Take 1 tablet (20 mg total) by mouth 2 (two) times daily with a meal for 7 days.    Dispense:  14 tablet    Refill:  0  . methylPREDNISolone sodium succinate (SOLU-MEDROL) 125 mg/2 mL injection 125 mg   Instructed him to stop the nasal  decongestant.  He will take the prednisone and start the Flonase.  We have given him a shot of Solu-Medrol 125.  He is to follow-up in 1 week if not doing better.  Follow-up: Return if symptoms worsen or fail to improve.  Mliss Sax, MD

## 2017-02-12 ENCOUNTER — Encounter: Payer: Self-pay | Admitting: Family Medicine

## 2017-02-13 ENCOUNTER — Ambulatory Visit (INDEPENDENT_AMBULATORY_CARE_PROVIDER_SITE_OTHER): Payer: Medicare Other | Admitting: Internal Medicine

## 2017-02-13 ENCOUNTER — Encounter: Payer: Self-pay | Admitting: Internal Medicine

## 2017-02-13 VITALS — BP 128/72 | HR 87 | Ht 66.0 in | Wt 199.6 lb

## 2017-02-13 DIAGNOSIS — R490 Dysphonia: Secondary | ICD-10-CM | POA: Diagnosis not present

## 2017-02-13 DIAGNOSIS — J40 Bronchitis, not specified as acute or chronic: Secondary | ICD-10-CM

## 2017-02-13 DIAGNOSIS — J986 Disorders of diaphragm: Secondary | ICD-10-CM

## 2017-02-13 MED ORDER — FLUTICASONE FUROATE-VILANTEROL 100-25 MCG/INH IN AEPB: 1.0000 | INHALATION_SPRAY | Freq: Every day | RESPIRATORY_TRACT | 0 refills | Status: DC

## 2017-02-13 MED ORDER — FLUTICASONE FUROATE-VILANTEROL 100-25 MCG/INH IN AEPB: 1.0000 | INHALATION_SPRAY | Freq: Every day | RESPIRATORY_TRACT | 5 refills | Status: DC

## 2017-02-13 NOTE — Patient Instructions (Addendum)
ICD-10-CM   1. 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 TRACHEOBRONCHITIS J40   3. 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

## 2017-02-13 NOTE — Progress Notes (Signed)
Subjective:    Patient ID: Tyler Tanner, male    DOB: Nov 06, 1945, 72 y.o.   MRN: [id number]  PCP Clovis Riley, L.August Saucer, MD   HPI  Tyler Tanner 02/13/2017  Chief Complaint  Patient presents with  . Advice Only    Self referral for wheezing and SOB which has been happening x1 month now. Pt states he also has complaints of hoarseness, cough with clear to yellowish green mucus and chest tightness.    72 year old male who says he is extremely active.  He quit smoking over 40 years ago and only has a 12 pack smoking history.  He says that approximately 1 month ago he started having sinus congestion and sore throat and bronchitis symptoms.  Since then he has had significant shortness of breath and hoarse voice.  He is also lost 3-4 pounds of weight unintentionally.  He says that he normally would walk 15-30 minutes daily.  When he does this same walking he gets very short of breath and he will have to follow through.  He still has cough.  Exam nitric oxide today is only 38 ppb and is borderline and indeterminate for eosinophilic asthma.  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  This is followed up with a CT scan of the chest that shows the same.  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  He does have some mild coronary artery calcification but has never had bypass surgery.  He did get antibiotics a month ago for a week but these have not helped.  Did get one steroid shot but this is not helped. No hx of regular chiropractic manipulations  feNO 02/13/2017 : 38 ppb and bordreline  Results for TYON, CERASOLI (MRN [id number]) as of 02/13/2017 11:43  Ref. Range 01/27/2017 13:57  Eosinophils Absolute Latest Ref Range: 0.0 - 0.7 K/uL 0.1    Walking desaturation test on 02/13/2017 185 feet x 3 laps on ROOM AIR:  did not desaturate. Rest pulse ox was 97%, final pulse ox was 99%. HR  response 82/min at rest to 94/min at peak exertion. Patient Tyler Tanner  Did not Desaturate < 88% . [name] did not  Desaturated </= 3% points. [name] did yes get tachyardic. Did this in moderate pace and did get dyspneic    has a past medical history of GERD (gastroesophageal reflux disease).   reports that he quit smoking about 41 years ago. His smoking use included cigarettes. He has a 12.00 pack-year smoking history. he has never used smokeless tobacco.  Past Surgical History:  Procedure Laterality Date  . TONSILLECTOMY      No Known Allergies  Immunization History  Administered Date(s) Administered  . Pneumococcal Conjugate-13 05/24/2015  . Td 04/01/2007    Family History  Problem Relation Age of Onset  . Diabetes Mother   . Coronary artery disease Brother      Current Outpatient Medications:  .  fluticasone (FLONASE) 50 MCG/ACT nasal spray, Place 2 sprays into both nostrils daily., Disp: 16 g, Rfl: 6 .  ibuprofen (ADVIL,MOTRIN) 800 MG tablet, Take 800 mg by mouth every 8 (eight) hours as needed for moderate pain. , Disp: , Rfl:  .  PROAIR HFA 108 (90 Base) MCG/ACT inhaler, Inhale 2 puffs into the lungs every 6 (six) hours as needed., Disp: , Rfl: 0 .  tamsulosin (FLOMAX) 0.4 MG CAPS capsule, Take 1 capsule (0.4 mg total) by mouth daily after supper., Disp: 14 capsule, Rfl: 0   Review of Systems  Constitutional: Negative for fever and unexpected weight change.  HENT: Positive for congestion, postnasal drip, sinus pressure and sneezing. Negative for dental problem, ear pain, nosebleeds, rhinorrhea, sore throat and trouble swallowing.   Eyes: Negative for redness and itching.  Respiratory: Positive for cough, chest tightness, shortness of breath and wheezing.   Cardiovascular: Negative for palpitations and leg swelling.  Gastrointestinal: Negative for nausea and vomiting.  Genitourinary: Negative for dysuria.  Musculoskeletal: Negative for joint  swelling.  Skin: Negative for rash.  Allergic/Immunologic: Negative.  Negative for environmental allergies, food allergies and immunocompromised state.  Neurological: Positive for headaches.  Hematological: Does not bruise/bleed easily.  Psychiatric/Behavioral: Negative for dysphoric mood. The patient is not nervous/anxious.        Objective:   Physical Exam  Constitutional: He is oriented to person, place, and time. He appears well-developed and well-nourished. No distress.  HENT:  Head: Normocephalic and atraumatic.  Right Ear: External ear normal.  Left Ear: External ear normal.  Mouth/Throat: Oropharynx is clear and moist. No oropharyngeal exudate.  Hoarse voice Tight nostrils Large Nose  Eyes: Conjunctivae and EOM are normal. Pupils are equal, round, and reactive to light. Right eye exhibits no discharge. Left eye exhibits no discharge. No scleral icterus.  Neck: Normal range of motion. Neck supple. No JVD present. No tracheal deviation present. No thyromegaly present.  Cardiovascular: Normal rate, regular rhythm and intact distal pulses. Exam reveals no gallop and no friction rub.  No murmur heard. Pulmonary/Chest: Effort normal and breath sounds normal. No respiratory distress. He has no wheezes. He has no rales. He exhibits no tenderness.  Diminished air entry left base c/w elevated hemi-diaphragm on left  Abdominal: Soft. Bowel sounds are normal. He exhibits no distension and no mass. There is no tenderness. There is no rebound and no guarding.  Musculoskeletal: Normal range of motion. He exhibits no edema or tenderness.  Lymphadenopathy:    He has no cervical adenopathy.  Neurological: He is alert and oriented to person, place, and time. He has normal reflexes. No cranial nerve deficit. Coordination normal.  Skin: Skin is warm and dry. No rash noted. He is not diaphoretic. No erythema. No pallor.  Psychiatric: He has a normal mood and affect. His behavior is normal. Judgment  and thought content normal.  Nursing note and vitals reviewed.   Vitals:   02/13/17 1131  BP: 128/72  Pulse: 87  SpO2: 95%  Weight: 199 lb 9.6 oz (90.5 kg)  Height: 5\' 6"  (1.676 m)    Estimated body mass index is 32.22 kg/m as calculated from the following:   Height as of this encounter: 5\' 6"  (1.676 m).   Weight as of this encounter: 199 lb 9.6 oz (90.5 kg).       Assessment & Plan:     ICD-10-CM   1. 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 TRACHEOBRONCHITIS J40   3. 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 Kalman ShanMurali Quasim Doyon, M.D., [facility] Center[facility]C.C.P Pulmonary and Critical Care Medicine Staff Physician, [facility] Director - Interstitial Lung Disease  Program  Pulmonary Fibrosis [facility] [place], [place], [id number]  Pager: [phone], If no answer or between  15:00h - 7:00h: call [phone] Telephone: [phone]

## 2017-02-13 NOTE — Addendum Note (Signed)
Addended by: Wyvonne LenzPINION, Perry Molla P on: 02/13/2017 12:21 PM   Modules accepted: Orders

## 2017-02-13 NOTE — Addendum Note (Signed)
Addended by: Wyvonne LenzPINION, Ivyrose Hashman P on: 02/13/2017 12:27 PM   Modules accepted: Orders

## 2017-02-15 ENCOUNTER — Telehealth: Payer: Self-pay | Admitting: Internal Medicine

## 2017-02-15 NOTE — Telephone Encounter (Signed)
Charmella is aware. Nothing further needed.

## 2017-02-15 NOTE — Telephone Encounter (Signed)
MR please advise can you sign this. Thanks!

## 2017-02-15 NOTE — Telephone Encounter (Signed)
As of 02/15/2017 12:59 PM I do not have pending opd orders to [employer]

## 2017-02-18 ENCOUNTER — Ambulatory Visit (HOSPITAL_COMMUNITY)
Admission: RE | Admit: 2017-02-18 | Discharge: 2017-02-18 | Disposition: A | Discharge status: Home / Self Care | Payer: Medicare Other | Source: Ambulatory Visit | Attending: Internal Medicine | Admitting: Internal Medicine

## 2017-02-18 DIAGNOSIS — J986 Disorders of diaphragm: Secondary | ICD-10-CM | POA: Diagnosis not present

## 2017-02-19 NOTE — Progress Notes (Signed)
Was able to talk to the patient regarding their results.  They verbalized an understanding of what was discussed. No further questions at this time. 

## 2017-02-20 ENCOUNTER — Telehealth: Payer: Self-pay | Admitting: Family Medicine

## 2017-02-20 NOTE — Telephone Encounter (Signed)
Copied from CRM [id number]. Topic: Quick Communication - Rx Refill/Question >> Feb 20, 2017  2:50 PM Maia PettiesOrtiz, Kristie S wrote: Medication: cough syrup - pt states it was prescribed on 02/07/17 - he is still coughing and hasn't gotten better - could not take the prednisone pills because they gave him heartburn - pt states he got a shot in the office Has the patient contacted their pharmacy? No. Preferred Pharmacy (with phone number or street name): [facility] [phone] G[place] Otto- [place], [place] - [address] [facility] [address] [place] [address] Phone: [phone] Fax: [phone]  Agent: Please be advised that RX refills may take up to 3 business days. We ask that you follow-up with your pharmacy.

## 2017-02-21 MED ORDER — BENZONATATE 200 MG PO CAPS: 200.0000 mg | ORAL_CAPSULE | Freq: Three times a day (TID) | ORAL | 0 refills | Status: DC | PRN

## 2017-02-21 NOTE — Telephone Encounter (Signed)
Called patient to clarify what he might need, whether cough med or an appointment. He stated that he is still coughing, coughed all night. He wants to know if another cough med could be called in for him. He said his last cough med expired on 02/07/17. He stated he did not want an appointment.

## 2017-02-21 NOTE — Telephone Encounter (Signed)
Rx sent in

## 2017-02-21 NOTE — Telephone Encounter (Signed)
Tessalon 200mg  1 po q 8 prn cough. #20   If this doesn't help, rtc.

## 2017-03-04 ENCOUNTER — Institutional Professional Consult (permissible substitution): Payer: Self-pay | Admitting: Emergency Medicine

## 2017-03-14 ENCOUNTER — Other Ambulatory Visit: Payer: Self-pay

## 2017-03-14 ENCOUNTER — Encounter: Payer: Self-pay | Admitting: Family Medicine

## 2017-03-14 ENCOUNTER — Ambulatory Visit (INDEPENDENT_AMBULATORY_CARE_PROVIDER_SITE_OTHER): Payer: Medicare Other | Admitting: Family Medicine

## 2017-03-14 VITALS — BP 112/68 | HR 93 | Temp 98.6°F | Resp 20 | Ht 66.34 in | Wt 188.4 lb

## 2017-03-14 DIAGNOSIS — R05 Cough: Secondary | ICD-10-CM

## 2017-03-14 DIAGNOSIS — J986 Disorders of diaphragm: Secondary | ICD-10-CM | POA: Diagnosis not present

## 2017-03-14 DIAGNOSIS — R053 Chronic cough: Secondary | ICD-10-CM

## 2017-03-14 MED ORDER — METHYLPREDNISOLONE SODIUM SUCC 125 MG IJ SOLR
125.0000 mg | Freq: Once | INTRAMUSCULAR | Status: AC
  Administered 2017-03-14: 125 mg via INTRAMUSCULAR

## 2017-03-14 MED ORDER — IPRATROPIUM BROMIDE 0.02 % IN SOLN
0.5000 mg | Freq: Once | RESPIRATORY_TRACT | Status: AC
  Administered 2017-03-14: 0.5 mg via RESPIRATORY_TRACT

## 2017-03-14 MED ORDER — ALBUTEROL SULFATE (2.5 MG/3ML) 0.083% IN NEBU
2.5000 mg | INHALATION_SOLUTION | Freq: Once | RESPIRATORY_TRACT | Status: AC
  Administered 2017-03-14: 2.5 mg via RESPIRATORY_TRACT

## 2017-03-14 NOTE — Progress Notes (Signed)
Chief Complaint  Patient presents with  . Cough    x2 months. Dizziness. Productive with white sputum. Shortness of breath, but not currently.    HPI  Patient has been coughing for 2 months He was evaluated by Pulmonology and ENT Pt reports that he could not take the prednisone due to severe heart burn and belching He has continued productive cough He is currently taking his albuterol inhaler He continues to have a cough with white sputum The cough seems to take his breath He uses his inhaler as instructed He states that he has been having continued coughing  REVIEW OF NOTE FROM PULMONOLOGY Advised Breo once daily since pt could not tolerate oral prednisone    Past Medical History:  Diagnosis Date  . GERD (gastroesophageal reflux disease)     Current Outpatient Medications  Medication Sig Dispense Refill  . fluticasone (FLONASE) 50 MCG/ACT nasal spray Place 2 sprays into both nostrils daily. 16 g 6  . fluticasone furoate-vilanterol (BREO ELLIPTA) 100-25 MCG/INH AEPB Inhale 1 puff into the lungs daily. 1 each 5  . ibuprofen (ADVIL,MOTRIN) 800 MG tablet Take 800 mg by mouth every 8 (eight) hours as needed for moderate pain.     Marland Kitchen PROAIR HFA 108 (90 Base) MCG/ACT inhaler Inhale 2 puffs into the lungs every 6 (six) hours as needed.  0  . tamsulosin (FLOMAX) 0.4 MG CAPS capsule Take 1 capsule (0.4 mg total) by mouth daily after supper. 14 capsule 0  . benzonatate (TESSALON) 200 MG capsule Take 1 capsule (200 mg total) by mouth every 8 (eight) hours as needed for cough. (Patient not taking: Reported on 03/14/2017) 20 capsule 0   Current Facility-Administered Medications  Medication Dose Route Frequency Provider Last Rate Last Dose  . albuterol (PROVENTIL) (2.5 MG/3ML) 0.083% nebulizer solution 2.5 mg  2.5 mg Nebulization Once Ion Gonnella A, MD      . ipratropium (ATROVENT) nebulizer solution 0.5 mg  0.5 mg Nebulization Once Doristine Bosworth, MD        Allergies: No Known  Allergies  Past Surgical History:  Procedure Laterality Date  . TONSILLECTOMY      Social History   Socioeconomic History  . Marital status: Married    Spouse name: None  . Number of children: None  . Years of education: None  . Highest education level: None  Social Needs  . Financial resource strain: None  . Food insecurity - worry: None  . Food insecurity - inability: None  . Transportation needs - medical: None  . Transportation needs - non-medical: None  Occupational History  . None  Tobacco Use  . Smoking status: Former Smoker    Packs/day: 1.00    Years: 12.00    Pack years: 12.00    Types: Cigarettes    Last attempt to quit: 07/25/1975    Years since quitting: 41.6  . Smokeless tobacco: Never Used  Substance and Sexual Activity  . Alcohol use: No  . Drug use: No  . Sexual activity: None  Other Topics Concern  . None  Social History Narrative  . None    Family History  Problem Relation Age of Onset  . Diabetes Mother   . Coronary artery disease Brother      ROS Review of Systems See HPI Constitution: No fevers or chills No malaise No diaphoresis Skin: No rash or itching Eyes: no blurry vision, no double vision GU: no dysuria or hematuria Neuro: no dizziness or headaches all others reviewed and  negative   Objective: Vitals:   03/14/17 1414  BP: 112/68  Pulse: 93  Resp: 20  Temp: 98.6 F (37 C)  SpO2: 95%  Weight: 188 lb 6.4 oz (85.5 kg)  Height: 5' 6.34" (1.685 m)    Physical Exam  General: alert, oriented, in NAD Head: normocephalic, atraumatic, no sinus tenderness Eyes: EOM intact, no scleral icterus or conjunctival injection Ears: TM clear bilaterally Nose: mucosa nonerythematous, nonedematous Throat: no pharyngeal exudate or erythema Lymph: no posterior auricular, submental or cervical lymph adenopathy Heart: normal rate, normal sinus rhythm, no murmurs Lungs: clear to auscultation on the right, poor air movement on the  left Post duoneb: improved air movement, no wheeznig    EXAM: CHEST FLUOROSCOPY  TECHNIQUE: Real-time fluoroscopic evaluation of the chest was performed.  FLUOROSCOPY TIME:  Fluoroscopy Time:  0.3 minutes.  Radiation Exposure Index (if provided by the fluoroscopic device): 1.5 mGy  COMPARISON:  Chest x-ray and chest CT dated 01/27/2017 and chest x-ray dated 09/04/2006  FINDINGS: There is paradoxical motion of the left hemidiaphragm with sniffing. Normal excursion of the right hemidiaphragm.  IMPRESSION: Paralysis of the left hemidiaphragm.   Electronically Signed   By: Francene Boyers M.D.   On: 02/18/2017 14:13 CLINICAL DATA:  1 month history of coughing congestion.  EXAM: CHEST  2 VIEW  COMPARISON:  09/04/2006  FINDINGS: Asymmetric elevation left hemidiaphragm with left base atelectasis. Prominent gastric bubble noted right lung clear. The cardiopericardial silhouette is within normal limits for size. The visualized bony structures of the thorax are intact. Telemetry leads overlie the chest.  IMPRESSION: Interval development of marked asymmetric elevation left hemidiaphragm with associated atelectasis.   Electronically Signed   By: Kennith Center M.D.   On: 01/27/2017 12:52  Study Result   CLINICAL DATA:  Cough and shortness of breath for 1 month, elevated LEFT hemidiaphragm, history of GERD  EXAM: CT CHEST WITHOUT CONTRAST  TECHNIQUE: Multidetector CT imaging of the chest was performed following the standard protocol without IV contrast. Sagittal and coronal MPR images reconstructed from axial data set.  COMPARISON:  None; correlation chest radiograph 01/27/2017  FINDINGS: Cardiovascular: Atherosclerotic calcifications aorta without aneurysm. Minimal coronary arterial calcification. No pericardial effusion.  Mediastinum/Nodes: Base of cervical region unremarkable. No thoracic adenopathy. Esophagus unremarkable. Mildly  prominent fat at inferior mediastinum question herniation through esophageal hiatus.  Lungs/Pleura: Scarring at medial aspect of RIGHT lower lobe. Subsegmental atelectasis at base of LEFT lower lobe. Remaining lungs clear. No pulmonary infiltrate, pleural effusion, or pneumothorax.  Upper Abdomen: Cholelithiasis. Remaining visualized upper abdomen unremarkable.  Musculoskeletal: Degenerative disc disease changes at visualized inferior cervical spine. Lytic lesion within manubrium LEFT of midline, well-defined, generally benign in appearance question hemangioma. No other bone lesions.  IMPRESSION: Medial RIGHT lower lobe scarring with subsegmental atelectasis at LEFT base.  Lungs otherwise unremarkable.  Cholelithiasis.  Aortic Atherosclerosis (ICD10-I70.0).   Electronically Signed   By: Ulyses Southward M.D.   On: 01/27/2017 15:50     Assessment and Plan Achille was seen today for cough.  Diagnoses and all orders for this visit:  Persistent cough- ADVISED pt to use BREO as instructed by Pulmonology Improved after Duoneb -     methylPREDNISolone sodium succinate (SOLU-MEDROL) 125 mg/2 mL injection 125 mg -     albuterol (PROVENTIL) (2.5 MG/3ML) 0.083% nebulizer solution 2.5 mg -     ipratropium (ATROVENT) nebulizer solution 0.5 mg  Elevated hemidiaphragm- discussed that this finding on the left will make him more prone to reflux  Continue PPI     Siomara Burkel A Jerson Furukawa

## 2017-03-14 NOTE — Patient Instructions (Addendum)
  Please use the Breo inhaler as directed. Follow up with Dr. Marchelle Gearingamaswamy in Pulmonology in a month.    IF you received an x-ray today, you will receive an invoice from [facility]. Please contact [facility] at [phone] with questions or concerns regarding your invoice.   IF you received labwork today, you will receive an invoice from [facility]. Please contact [facility] at [phone] with questions or concerns regarding your invoice.   Our billing staff will not be able to assist you with questions regarding bills from these companies.  You will be contacted with the lab results as soon as they are available. The fastest way to get your results is to activate your My Chart account. Instructions are located on the last page of this paperwork. If you have not heard from us regarding the results in 2 weeks, please contact this office.

## 2017-03-27 ENCOUNTER — Other Ambulatory Visit: Payer: Self-pay

## 2017-03-27 ENCOUNTER — Ambulatory Visit (INDEPENDENT_AMBULATORY_CARE_PROVIDER_SITE_OTHER): Payer: Medicare Other | Admitting: Family Medicine

## 2017-03-27 ENCOUNTER — Encounter: Payer: Self-pay | Admitting: Family Medicine

## 2017-03-27 ENCOUNTER — Ambulatory Visit: Payer: Medicare Other | Admitting: Family Medicine

## 2017-03-27 VITALS — BP 121/74 | HR 91 | Temp 98.4°F | Resp 17 | Ht 66.34 in | Wt 187.4 lb

## 2017-03-27 DIAGNOSIS — R05 Cough: Secondary | ICD-10-CM

## 2017-03-27 DIAGNOSIS — R053 Chronic cough: Secondary | ICD-10-CM

## 2017-03-27 NOTE — Progress Notes (Signed)
Chief Complaint  Patient presents with  . cough/allergy symptoms    needs breathing treatment as this helped tremendously last time.  Pt would like rx for neb machine   . ? pinched nerve in right knee along with arthritis    pt wanting pain med (? tramadol)    HPI  Pt reports that he is still having symptoms with coughing that is worse at night He is using the [facility] without much improvement At night he has to go outside for fresh air He states that when he takes the [place] he feels weak and coughs more He also has runny nose and congestion He has an appt with Pulmonology 04/03/2017    Past Medical History:  Diagnosis Date  . GERD (gastroesophageal reflux disease)     Current Outpatient Medications  Medication Sig Dispense Refill  . fluticasone (FLONASE) 50 MCG/ACT nasal spray Place 2 sprays into both nostrils daily. 16 g 6  . fluticasone furoate-vilanterol (BREO ELLIPTA) 100-25 MCG/INH AEPB Inhale 1 puff into the lungs daily. 1 each 5  . PROAIR HFA 108 (90 Base) MCG/ACT inhaler Inhale 2 puffs into the lungs every 6 (six) hours as needed.  0  . tamsulosin (FLOMAX) 0.4 MG CAPS capsule Take 1 capsule (0.4 mg total) by mouth daily after supper. 14 capsule 0  . ibuprofen (ADVIL,MOTRIN) 800 MG tablet Take 800 mg by mouth every 8 (eight) hours as needed for moderate pain.      No current facility-administered medications for this visit.     Allergies: No Known Allergies  Past Surgical History:  Procedure Laterality Date  . TONSILLECTOMY      Social History   Socioeconomic History  . Marital status: Married    Spouse name: Not on file  . Number of children: Not on file  . Years of education: Not on file  . Highest education level: Not on file  Occupational History  . Not on file  Social Needs  . Financial resource strain: Not on file  . Food insecurity:    Worry: Not on file    Inability: Not on file  . Transportation needs:    Medical: Not on file    Non-medical: Not  on file  Tobacco Use  . Smoking status: Former Smoker    Packs/day: 1.00    Years: 12.00    Pack years: 12.00    Types: Cigarettes    Last attempt to quit: 07/25/1975    Years since quitting: 41.7  . Smokeless tobacco: Never Used  Substance and Sexual Activity  . Alcohol use: No  . Drug use: No  . Sexual activity: Not on file  Lifestyle  . Physical activity:    Days per week: Not on file    Minutes per session: Not on file  . Stress: Not on file  Relationships  . Social connections:    Talks on phone: Not on file    Gets together: Not on file    Attends religious service: Not on file    Active member of club or organization: Not on file    Attends meetings of clubs or organizations: Not on file    Relationship status: Not on file  Other Topics Concern  . Not on file  Social History Narrative  . Not on file    Family History  Problem Relation Age of Onset  . Diabetes Mother   . Coronary artery disease Brother      ROS Review of Systems See HPI  Constitution: No fevers or chills No malaise No diaphoresis Skin: No rash or itching Eyes: no blurry vision, no double vision GU: no dysuria or hematuria Neuro: no dizziness or headaches  all others reviewed and negative   Objective: Vitals:   03/27/17 1652  BP: 121/74  Pulse: 91  Resp: 17  Temp: 98.4 F (36.9 C)  TempSrc: Oral  SpO2: 96%  Weight: 187 lb 6.4 oz (85 kg)  Height: 5' 6.34" (1.685 m)    Physical Exam General: alert, oriented, in NAD Head: normocephalic, atraumatic, no sinus tenderness Eyes: EOM intact, no scleral icterus or conjunctival injection Ears: TM clear bilaterally Nose: mucosa nonerythematous, nonedematous Throat: no pharyngeal exudate or erythema Lymph: no posterior auricular, submental or cervical lymph adenopathy Heart: normal rate, normal sinus rhythm, no murmurs Lungs: clear to auscultation bilaterally, no wheezing  CLINICAL DATA:  Cough and shortness of breath for 1 month,  elevated LEFT hemidiaphragm, history of GERD  EXAM: CT CHEST WITHOUT CONTRAST  TECHNIQUE: Multidetector CT imaging of the chest was performed following the standard protocol without IV contrast. Sagittal and coronal MPR images reconstructed from axial data set.  COMPARISON:  None; correlation chest radiograph 01/27/2017  FINDINGS: Cardiovascular: Atherosclerotic calcifications aorta without aneurysm. Minimal coronary arterial calcification. No pericardial effusion.  Mediastinum/Nodes: Base of cervical region unremarkable. No thoracic adenopathy. Esophagus unremarkable. Mildly prominent fat at inferior mediastinum question herniation through esophageal hiatus.  Lungs/Pleura: Scarring at medial aspect of RIGHT lower lobe. Subsegmental atelectasis at base of LEFT lower lobe. Remaining lungs clear. No pulmonary infiltrate, pleural effusion, or pneumothorax.  Upper Abdomen: Cholelithiasis. Remaining visualized upper abdomen unremarkable.  Musculoskeletal: Degenerative disc disease changes at visualized inferior cervical spine. Lytic lesion within manubrium LEFT of midline, well-defined, generally benign in appearance question hemangioma. No other bone lesions.  IMPRESSION: Medial RIGHT lower lobe scarring with subsegmental atelectasis at LEFT base.  Lungs otherwise unremarkable.  Cholelithiasis.  Aortic Atherosclerosis (ICD10-I70.0).   Electronically Signed   By: Ulyses SouthwardMark  Boles M.D.   On: 01/27/2017 15:50    Assessment and Plan Alinda MoneyMelvin was seen today for cough/allergy symptoms and ? pinched nerve in right knee along with arthritis.  Diagnoses and all orders for this visit:  Persistent cough   -  Pt with continued symptoms despite Breo which only seems to cause minimal improvement but numerous side effects Very forceful body wrenching coughing  Continues to struggle with sleep due to cough Normal lung exam on auscultation Follow up with  Pulmonology  Damiya Sandefur A Creta LevinStallings

## 2017-03-27 NOTE — Patient Instructions (Signed)
     IF you received an x-ray today, you will receive an invoice from [facility]. Please contact [facility] at [phone] with questions or concerns regarding your invoice.   IF you received labwork today, you will receive an invoice from [facility]. Please contact [facility] at [phone] with questions or concerns regarding your invoice.   Our billing staff will not be able to assist you with questions regarding bills from these companies.  You will be contacted with the lab results as soon as they are available. The fastest way to get your results is to activate your My Chart account. Instructions are located on the last page of this paperwork. If you have not heard from us regarding the results in 2 weeks, please contact this office.     

## 2017-04-03 ENCOUNTER — Encounter: Payer: Self-pay | Admitting: Internal Medicine

## 2017-04-03 ENCOUNTER — Ambulatory Visit (INDEPENDENT_AMBULATORY_CARE_PROVIDER_SITE_OTHER): Payer: Medicare Other | Admitting: Internal Medicine

## 2017-04-03 VITALS — BP 138/70 | HR 80 | Ht 66.0 in | Wt 187.0 lb

## 2017-04-03 DIAGNOSIS — J42 Unspecified chronic bronchitis: Secondary | ICD-10-CM | POA: Diagnosis not present

## 2017-04-03 DIAGNOSIS — J986 Disorders of diaphragm: Secondary | ICD-10-CM

## 2017-04-03 LAB — PULMONARY FUNCTION TEST
DL/VA % pred: 88 %
DL/VA: 3.9 ml/min/mmHg/L
DLCO UNC % PRED: 67 %
DLCO cor % pred: 64 %
DLCO cor: 18.22 ml/min/mmHg
DLCO unc: 19.03 ml/min/mmHg
FEF 25-75 PRE: 0.77 L/s
FEF 25-75 Post: 2.44 L/sec
FEF2575-%Change-Post: 217 %
FEF2575-%PRED-POST: 110 %
FEF2575-%Pred-Pre: 34 %
FEV1-%CHANGE-POST: 24 %
FEV1-%PRED-PRE: 60 %
FEV1-%Pred-Post: 75 %
FEV1-PRE: 1.74 L
FEV1-Post: 2.18 L
FEV1FVC-%Change-Post: 8 %
FEV1FVC-%Pred-Pre: 92 %
FEV6-%CHANGE-POST: 15 %
FEV6-%PRED-POST: 78 %
FEV6-%PRED-PRE: 68 %
FEV6-POST: 2.92 L
FEV6-PRE: 2.52 L
FEV6FVC-%CHANGE-POST: 0 %
FEV6FVC-%PRED-PRE: 105 %
FEV6FVC-%Pred-Post: 106 %
FVC-%CHANGE-POST: 15 %
FVC-%Pred-Post: 74 %
FVC-%Pred-Pre: 64 %
FVC-Post: 2.92 L
FVC-Pre: 2.54 L
POST FEV1/FVC RATIO: 74 %
POST FEV6/FVC RATIO: 100 %
PRE FEV6/FVC RATIO: 99 %
Pre FEV1/FVC ratio: 69 %
RV % PRED: 93 %
RV: 2.13 L
TLC % pred: 75 %
TLC: 4.82 L

## 2017-04-03 MED ORDER — PREDNISONE 10 MG PO TABS: ORAL_TABLET | ORAL | 0 refills | Status: DC

## 2017-04-03 MED ORDER — DOXYCYCLINE HYCLATE 100 MG PO TABS: 100.0000 mg | ORAL_TABLET | Freq: Two times a day (BID) | ORAL | 0 refills | Status: DC

## 2017-04-03 MED ORDER — IPRATROPIUM-ALBUTEROL 0.5-2.5 (3) MG/3ML IN SOLN: 3.0000 mL | Freq: Four times a day (QID) | RESPIRATORY_TRACT | 12 refills | Status: DC | PRN

## 2017-04-03 NOTE — Addendum Note (Signed)
Addended by: Kalman ShanAMASWAMY, Ellysa Parrack on: 04/03/2017 12:22 PM   Modules accepted: Level of Service

## 2017-04-03 NOTE — Progress Notes (Signed)
PFT done today. 

## 2017-04-03 NOTE — Patient Instructions (Addendum)
ICD-10-CM   1. Chronic bronchitis, unspecified chronic bronchitis type (HCC) J42   2. 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

## 2017-04-03 NOTE — Progress Notes (Addendum)
Subjective:     Patient ID: Tyler Tanner, male   DOB: 03/03/1945, 72 y.o.   MRN: [id number]  HPI  PCP Clovis Riley, L.August Saucer, MD   HPI  [name] 02/13/2017  Chief Complaint  Patient presents with  . Advice Only    Self referral for wheezing and SOB which has been happening x1 month now. Pt states he also has complaints of hoarseness, cough with clear to yellowish green mucus and chest tightness.    72 year old male who says he is extremely active.  He quit smoking over 40 years ago and only has a 12 pack smoking history.  He says that approximately 1 month ago he started having sinus congestion and sore throat and bronchitis symptoms.  Since then he has had significant shortness of breath and hoarse voice.  He is also lost 3-4 pounds of weight unintentionally.  He says that he normally would walk 15-30 minutes daily.  When he does this same walking he gets very short of breath and he will have to follow through.  He still has cough.  Exam nitric oxide today is only 38 ppb and is borderline and indeterminate for eosinophilic asthma.  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  This is followed up with a CT scan of the chest that shows the same.  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  He does have some mild coronary artery calcification but has never had bypass surgery.  He did get antibiotics a month ago for a week but these have not helped.  Did get one steroid shot but this is not helped. No hx of regular chiropractic manipulations  feNO 02/13/2017 : 38 ppb and bordreline  Results for JEHAD, BISONO (MRN [id number]) as of 02/13/2017 11:43  Ref. Range 01/27/2017 13:57  Eosinophils Absolute Latest Ref Range: 0.0 - 0.7 K/uL 0.1    Walking desaturation test on 02/13/2017 185 feet x 3 laps on ROOM AIR:  did not desaturate. Rest pulse ox was 97%, final pulse ox was 99%.  HR response 82/min at rest to 94/min at peak exertion. Patient WRIGHT GRAVELY  Did not Desaturate < 88% . [name] did not  Desaturated </= 3% points. [name] did yes get tachyardic. Did this in moderate pace and did get dyspneic    has a past medical history of GERD (gastroesophageal reflux disease).   reports that he quit smoking about 41 years ago. His smoking use included cigarettes. He has a 12.00 pack-year smoking history. he has never used smokeless tobacco.  OV 04/03/2017  Chief Complaint  Patient presents with  . Follow-up    vomiting and diarrhea last night, unsure what caused it, breo makes him cough and feel weak.     Tyler Tanner presents for follow-up.  He underwent sniffed test and it is confirmed that he has a paralyzed left hemidiaphragm.  For the sinus issues he saw Dr. Jearld Fenton.  I reviewed the note he was given Augmentin and this helped her sinus issues.  However he tells me that he continues to have significant cough and congestion and white sputum.  He feels the Brio that I gave him did not help him at all.  In fact it made him cough more and made him feel weak.  Therefore he stopped it after a week.  He is  frustrated by his symptoms.  He does not think any of his symptoms including chest tightness are related to the paralyzed left diaphragm.  He continues to insist that he is quit smoking although I could smell tobacco on his clothes.  He says he would prefer nebulizers because these have helped him in the past.  He says albuterol inhaler helps a lot      has a past medical history of GERD (gastroesophageal reflux disease).   reports that he quit smoking about 41 years ago. His smoking use included cigarettes. He has a 12.00 pack-year smoking history. He has never used smokeless tobacco.  Past Surgical History:  Procedure Laterality Date  . TONSILLECTOMY      No Known Allergies  Immunization History  Administered Date(s) Administered  .  Pneumococcal Conjugate-13 05/24/2015  . Td 04/01/2007    Family History  Problem Relation Age of Onset  . Diabetes Mother   . Coronary artery disease Brother      Current Outpatient Medications:  .  fluticasone (FLONASE) 50 MCG/ACT nasal spray, Place 2 sprays into both nostrils daily., Disp: 16 g, Rfl: 6 .  ibuprofen (ADVIL,MOTRIN) 800 MG tablet, Take 800 mg by mouth every 8 (eight) hours as needed for moderate pain. , Disp: , Rfl:  .  PROAIR HFA 108 (90 Base) MCG/ACT inhaler, Inhale 2 puffs into the lungs every 6 (six) hours as needed., Disp: , Rfl: 0 .  tamsulosin (FLOMAX) 0.4 MG CAPS capsule, Take 1 capsule (0.4 mg total) by mouth daily after supper., Disp: 14 capsule, Rfl: 0 .  fluticasone furoate-vilanterol (BREO ELLIPTA) 100-25 MCG/INH AEPB, Inhale 1 puff into the lungs daily. (Patient not taking: Reported on 04/03/2017), Disp: 1 each, Rfl: 5    Review of Systems     Objective:   Physical Exam  Constitutional: He is oriented to person, place, and time. He appears well-developed and well-nourished. No distress.  HENT:  Head: Normocephalic and atraumatic.  Right Ear: External ear normal.  Left Ear: External ear normal.  Mouth/Throat: Oropharynx is clear and moist. No oropharyngeal exudate.  Eyes: Pupils are equal, round, and reactive to light. Conjunctivae and EOM are normal. Right eye exhibits no discharge. Left eye exhibits no discharge. No scleral icterus.  Neck: Normal range of motion. Neck supple. No JVD present. No tracheal deviation present. No thyromegaly present.  Cardiovascular: Normal rate, regular rhythm and intact distal pulses. Exam reveals no gallop and no friction rub.  No murmur heard. Pulmonary/Chest: Effort normal and breath sounds normal. No respiratory distress. He has no wheezes. He has no rales. He exhibits no tenderness.  Abdominal: Soft. Bowel sounds are normal. He exhibits no distension and no mass. There is no tenderness. There is no rebound and no  guarding.  Musculoskeletal: Normal range of motion. He exhibits no edema or tenderness.  Lymphadenopathy:    He has no cervical adenopathy.  Neurological: He is alert and oriented to person, place, and time. He has normal reflexes. No cranial nerve deficit. Coordination normal.  Skin: Skin is warm and dry. No rash noted. He is not diaphoretic. No erythema. No pallor.  Psychiatric: He has a normal mood and affect. His behavior is normal. Judgment and thought content normal.  Nursing note and vitals reviewed.  . Vitals:   04/03/17 1137  BP: 138/70  Pulse: 80  SpO2: 95%  Weight: 187 lb (84.8 kg)  Height: 5\' 6"  (1.676 m)    Estimated body mass index is 30.18 kg/m as calculated  from the following:   Height as of this encounter: 5\' 6"  (1.676 m).   Weight as of this encounter: 187 lb (84.8 kg).      Assessment:       ICD-10-CM   1. Chronic bronchitis, unspecified chronic bronchitis type (HCC) J42   2. 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        Plan:      I think you have chronic bronchitis Glad sinus Rx with DR Jearld FentonByers helped   Plan - - Take doxycycline 100mg  po twice daily x 5 days; take after meals and avoid sunlight - Take prednisone 40 mg daily x 2 days, then 20mg  daily x 2 days, then 10mg  daily x 2 days, then 5mg  daily x 2 days and stop - start duoneb 4 times daily as needed (neb Rx preferred based on discussion with you) - use albuterol mdi as needed - will remove breo from med list because it did not help - continue flonase  Follouwp 3 months or sooner if needed  > 50% of this > 25 min visit spent in face to face counseling or coordination of care    Dr. Kalman ShanMurali Kassondra Geil, M.D., [facility]C.P Pulmonary and Critical Care Medicine Staff Physician, [facility] Center Director - Interstitial Lung Disease  Program  Pulmonary Fibrosis [facility] [place], [place], [id number]  Pager: [phone], If no answer or between  15:00h - 7:00h:  call [phone] Telephone: [phone]

## 2017-04-03 NOTE — Addendum Note (Signed)
Addended by: Wyvonne LenzPINION, Lynisha Osuch P on: 04/03/2017 12:27 PM   Modules accepted: Orders

## 2017-04-04 ENCOUNTER — Telehealth: Payer: Self-pay | Admitting: Internal Medicine

## 2017-04-04 MED ORDER — IPRATROPIUM-ALBUTEROL 0.5-2.5 (3) MG/3ML IN SOLN: 3.0000 mL | Freq: Four times a day (QID) | RESPIRATORY_TRACT | 12 refills | Status: DC | PRN

## 2017-04-04 NOTE — Telephone Encounter (Signed)
Pt is calling back. Per Pt pharm still does not have the prescription for DuoNeb. Cb is [phone].

## 2017-04-04 NOTE — Telephone Encounter (Signed)
Called and spoke with patient regarding Duoneb RX Pt reports that the RX was not rec'd by the pharmacy - [facility] Duone[facility], pt can p/u today Nothing further needed

## 2017-04-08 ENCOUNTER — Telehealth: Payer: Self-pay | Admitting: Internal Medicine

## 2017-04-08 MED ORDER — PREDNISONE 10 MG PO TABS: ORAL_TABLET | ORAL | 0 refills | Status: DC

## 2017-04-08 MED ORDER — DOXYCYCLINE HYCLATE 100 MG PO TABS: 100.0000 mg | ORAL_TABLET | Freq: Two times a day (BID) | ORAL | 0 refills | Status: DC

## 2017-04-08 NOTE — Telephone Encounter (Signed)
Spoke with patient. He is aware that MR has approved the RXs.   RXs have been sent in. Patient is aware. Nothing else needed at time of call.

## 2017-04-08 NOTE — Telephone Encounter (Signed)
Spoke with patient. He was given Doxycycline 100mg  twice daily for 5 days and prednisone 4x2, 2x2, 1x2 0.5x2 back on 04/03/17 for his severe cough.   Patient stated that the cough is starting to go away, but he is requesting another round of Doxy and Prednisone to finish getting rid of it. Denies any SOB, fever or body aches.   [facility] [address]   MR, please advise. Thanks!

## 2017-04-08 NOTE — Telephone Encounter (Signed)
Ok to repeat  Please take prednisone 40 mg x1 day, then 30 mg x1 day, then 20 mg x1 day, then 10 mg x1 day, and then 5 mg x1 day and stop  Take doxycycline 100mg  po twice daily x 5 days; take after meals and avoid sunlight

## 2017-05-01 ENCOUNTER — Encounter: Payer: Self-pay | Admitting: Family Medicine

## 2017-05-02 ENCOUNTER — Ambulatory Visit (INDEPENDENT_AMBULATORY_CARE_PROVIDER_SITE_OTHER): Payer: Medicare Other | Admitting: Family Medicine

## 2017-05-02 ENCOUNTER — Encounter: Payer: Self-pay | Admitting: Family Medicine

## 2017-05-02 VITALS — BP 128/80 | HR 86 | Ht 66.0 in | Wt 186.4 lb

## 2017-05-02 DIAGNOSIS — N401 Enlarged prostate with lower urinary tract symptoms: Secondary | ICD-10-CM | POA: Diagnosis not present

## 2017-05-02 DIAGNOSIS — R972 Elevated prostate specific antigen [PSA]: Secondary | ICD-10-CM | POA: Diagnosis not present

## 2017-05-02 DIAGNOSIS — Z87898 Personal history of other specified conditions: Secondary | ICD-10-CM | POA: Diagnosis not present

## 2017-05-02 DIAGNOSIS — Z8639 Personal history of other endocrine, nutritional and metabolic disease: Secondary | ICD-10-CM | POA: Diagnosis not present

## 2017-05-02 DIAGNOSIS — Z0000 Encounter for general adult medical examination without abnormal findings: Secondary | ICD-10-CM

## 2017-05-02 DIAGNOSIS — R3911 Hesitancy of micturition: Secondary | ICD-10-CM

## 2017-05-02 DIAGNOSIS — Z1211 Encounter for screening for malignant neoplasm of colon: Secondary | ICD-10-CM | POA: Diagnosis not present

## 2017-05-02 LAB — COMPREHENSIVE METABOLIC PANEL
ALT: 17 U/L (ref 0–53)
AST: 18 U/L (ref 0–37)
Albumin: 3.9 g/dL (ref 3.5–5.2)
Alkaline Phosphatase: 63 U/L (ref 39–117)
BILIRUBIN TOTAL: 1 mg/dL (ref 0.2–1.2)
BUN: 12 mg/dL (ref 6–23)
CALCIUM: 9.1 mg/dL (ref 8.4–10.5)
CHLORIDE: 104 meq/L (ref 96–112)
CO2: 26 mEq/L (ref 19–32)
Creatinine, Ser: 1.1 mg/dL (ref 0.40–1.50)
GFR: 70.34 mL/min (ref >60.00)
Glucose, Bld: 94 mg/dL (ref 70–99)
POTASSIUM: 4.1 meq/L (ref 3.5–5.1)
Sodium: 138 mEq/L (ref 135–145)
Total Protein: 6.3 g/dL (ref 6.0–8.3)

## 2017-05-02 LAB — LIPID PANEL
CHOLESTEROL: 171 mg/dL (ref 0–200)
HDL: 49.3 mg/dL (ref >39.00)
LDL CALC: 100 mg/dL — AB (ref 0–99)
NonHDL: 121.91
TRIGLYCERIDES: 110 mg/dL (ref 0.0–149.0)
Total CHOL/HDL Ratio: 3
VLDL: 22 mg/dL (ref 0.0–40.0)

## 2017-05-02 LAB — URINALYSIS, ROUTINE W REFLEX MICROSCOPIC
BILIRUBIN URINE: NEGATIVE
HGB URINE DIPSTICK: NEGATIVE
Ketones, ur: NEGATIVE
LEUKOCYTES UA: NEGATIVE
Nitrite: NEGATIVE
PH: 5.5 (ref 5.0–8.0)
Specific Gravity, Urine: 1.025 (ref 1.000–1.030)
Total Protein, Urine: NEGATIVE
UROBILINOGEN UA: 0.2 (ref 0.0–1.0)
Urine Glucose: NEGATIVE

## 2017-05-02 LAB — CBC
HEMATOCRIT: 46.5 % (ref 39.0–52.0)
HEMOGLOBIN: 16 g/dL (ref 13.0–17.0)
MCHC: 34.3 g/dL (ref 30.0–36.0)
MCV: 89.7 fl (ref 78.0–100.0)
PLATELETS: 204 10*3/uL (ref 150.0–400.0)
RBC: 5.19 Mil/uL (ref 4.22–5.81)
RDW: 13.5 % (ref 11.5–15.5)
WBC: 4.3 10*3/uL (ref 4.0–10.5)

## 2017-05-02 LAB — HEMOGLOBIN A1C: Hgb A1c MFr Bld: 5.9 % (ref 4.6–6.5)

## 2017-05-02 LAB — PSA: PSA: 7.41 ng/mL — ABNORMAL HIGH (ref 0.10–4.00)

## 2017-05-02 NOTE — Progress Notes (Addendum)
Subjective:  Patient ID: Tyler Tanner, male    DOB: Jan 08, 1945  Age: 72 y.o. MRN: [id number]  CC: Annual Exam   HPI Tyler Tanner presents for a complete physical exam.  He is fasting.  He lives with his wife.  He is retired.  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  He does exercise several times a day by walking.  He quit smoking and drinking 40 years ago.  He has never had a colonoscopy.  He has a 4 year old daughter who lives in [place].  He does have a history of BPH with some morning urinary hesitancy.  This is well treated with his Flomax that he takes daily.  He has had both pneumonia vaccines but refuses the flu vaccine.  Outpatient Medications Prior to Visit  Medication Sig Dispense Refill  . fluticasone (FLONASE) 50 MCG/ACT nasal spray Place 2 sprays into both nostrils daily. 16 g 6  . ibuprofen (ADVIL,MOTRIN) 800 MG tablet Take 800 mg by mouth every 8 (eight) hours as needed for moderate pain.     Marland Kitchen ipratropium-albuterol (DUONEB) 0.5-2.5 (3) MG/3ML SOLN Take 3 mLs by nebulization 4 (four) times daily as needed. 360 mL 12  . PROAIR HFA 108 (90 Base) MCG/ACT inhaler Inhale 2 puffs into the lungs every 6 (six) hours as needed.  0  . tamsulosin (FLOMAX) 0.4 MG CAPS capsule Take 1 capsule (0.4 mg total) by mouth daily after supper. 14 capsule 0  . doxycycline (VIBRA-TABS) 100 MG tablet Take 1 tablet (100 mg total) by mouth 2 (two) times daily. 10 tablet 0  . doxycycline (VIBRA-TABS) 100 MG tablet Take 1 tablet (100 mg total) by mouth 2 (two) times daily. 10 tablet 0  . predniSONE (DELTASONE) 10 MG tablet Take x2days,20mg x2days,10mg x2days,5mg x2days,then stop 15 tablet 0  . predniSONE (DELTASONE) 10 MG tablet 4 tabs x1 day, 3 tabs x1 day, 2 tabs x1 day, 1 tabs x1 day. 10 tablet 0   No facility-administered medications prior to visit.     ROS Review of Systems  Constitutional: Negative.   HENT: Negative.   Eyes: Negative for photophobia and visual  disturbance.  Respiratory: Negative.   Cardiovascular: Negative.   Gastrointestinal: Negative.   Endocrine: Negative for polyphagia and polyuria.  Genitourinary: Positive for difficulty urinating. Negative for decreased urine volume, frequency, hematuria and urgency.  Musculoskeletal: Negative for gait problem and myalgias.  Skin: Negative.   Allergic/Immunologic: Negative for immunocompromised state.  Neurological: Negative for weakness and headaches.  Hematological: Does not bruise/bleed easily.  Psychiatric/Behavioral: Negative.     Objective:  BP 128/80   Pulse 86   Ht  (1.676 m)   Wt 186 lb 6 oz (84.5 kg)   SpO2 93%   BMI 30.08 kg/m   BP Readings from Last 3 Encounters:  05/02/17 128/80  04/03/17 138/70  03/27/17 121/74    Wt Readings from Last 3 Encounters:  05/02/17 186 lb 6 oz (84.5 kg)  04/03/17 187 lb (84.8 kg)  03/27/17 187 lb 6.4 oz (85 kg)    Physical Exam  Constitutional: He is oriented to person, place, and time. He appears well-developed and well-nourished. No distress.  HENT:  Head: Normocephalic and atraumatic.  Right Ear: External ear normal.  Left Ear: External ear normal.  Mouth/Throat: Oropharynx is clear and moist. No oropharyngeal exudate.  Eyes: Pupils are equal, round, and reactive to light. Conjunctivae and EOM are normal. Right eye exhibits no discharge. Left eye exhibits  no discharge. No scleral icterus.  Neck: Normal range of motion. No JVD present. No tracheal deviation present. No thyromegaly present.  Cardiovascular: Normal rate, regular rhythm and normal heart sounds.  Pulmonary/Chest: Effort normal and breath sounds normal.  Abdominal: Soft. Bowel sounds are normal. He exhibits no distension and no mass. There is no tenderness. There is no rebound and no guarding.  Genitourinary: Rectum normal. Rectal exam shows no external hemorrhoid, no internal hemorrhoid, no fissure, no mass, no tenderness and guaiac negative stool. Prostate  is enlarged. Prostate is not tender.  Lymphadenopathy:    He has no cervical adenopathy.  Neurological: He is alert and oriented to person, place, and time.  Skin: Skin is warm and dry. Capillary refill takes less than 2 seconds. He is not diaphoretic.  Psychiatric: He has a normal mood and affect. His behavior is normal.    Lab Results  Component Value Date   WBC 4.3 05/02/2017   HGB 16.0 05/02/2017   HCT 46.5 05/02/2017   PLT 204.0 05/02/2017   GLUCOSE 94 05/02/2017   CHOL 171 05/02/2017   TRIG 110.0 05/02/2017   HDL 49.30 05/02/2017   LDLCALC 100 (H) 05/02/2017   ALT 17 05/02/2017   AST 18 05/02/2017   NA 138 05/02/2017   K 4.1 05/02/2017   CL 104 05/02/2017   CREATININE 1.10 05/02/2017   BUN 12 05/02/2017   CO2 26 05/02/2017   PSA 7.41 (H) 05/02/2017   HGBA1C 5.9 05/02/2017    Dg Sniff Test  Result Date: 02/18/2017 CLINICAL DATA:  Shortness of breath.  Elevated left hemidiaphragm. EXAM: CHEST FLUOROSCOPY TECHNIQUE: Real-time fluoroscopic evaluation of the chest was performed. FLUOROSCOPY TIME:  Fluoroscopy Time:  0.3 minutes. Radiation Exposure Index (if provided by the fluoroscopic device): 1.5 mGy COMPARISON:  Chest x-ray and chest CT dated 01/27/2017 and chest x-ray dated 09/04/2006 FINDINGS: There is paradoxical motion of the left hemidiaphragm with sniffing. Normal excursion of the right hemidiaphragm. IMPRESSION: Paralysis of the left hemidiaphragm. Electronically Signed   By: Tyler Tanner M.D.   On: 02/18/2017 14:13    Assessment & Plan:   Tyler Tanner was seen today for annual exam.  Diagnoses and all orders for this visit:  Health care maintenance -     CBC -     Lipid panel  History of elevated glucose -     CBC -     Comprehensive metabolic panel -     Hemoglobin A1c -     Lipid panel -     Urinalysis, Routine w reflex microscopic  History of elevated PSA -     PSA  Screen for colon cancer -     Ambulatory referral to Gastroenterology  Benign  prostatic hyperplasia with urinary hesitancy  Increased prostate specific antigen (PSA) velocity -     Ambulatory referral to Urology   I have discontinued Tyler Tanner's predniSONE, doxycycline, predniSONE, and doxycycline. I am also having him maintain his ibuprofen, tamsulosin, fluticasone, PROAIR HFA, and ipratropium-albuterol.  No orders of the defined types were placed in this encounter.    Follow-up: Return in about 3 months (around 08/02/2017).  Mliss Sax, MD

## 2017-05-02 NOTE — Patient Instructions (Signed)
Cancer Screening for Men A cancer screening is a test or exam that checks for cancer. Your health care provider will recommend specific cancer screenings based on your age, personal history, and family history of cancer. Work with your health care provider to create a cancer screening schedule that protects your health. Why is cancer screening done? Cancer screening is done to look for cancer in the very early stages, before it spreads and becomes harder to treat and before you would start to notice symptoms. Finding cancer early improves the chances of successful treatment. It may save your life. Who should be screened for cancer? All men should be screened for colorectal cancer and skin cancer. Your health care provider may recommend screenings for other types of cancer if:  You had cancer before.  You have a family member with cancer.  You have abnormal genes that could increase the risk of cancer.  You have risk factors for certain cancers, such as smoking.  When you should be screened for cancer depends on:  Your age.  Your medical history and your family's medical history.  Certain lifestyle factors, such as smoking.  Environmental exposure, such as to asbestos.  What are some common cancer screenings? Lung cancer Lung cancer screening is done with a CT scan that looks for abnormal cells in the lungs. Discuss lung cancer screening with your health care provider if you are 30-18 years old and if any of the following apply to you:  You currently smoke.  You used to smoke heavily.  You have had at least a 30-pack-year smoking history.  You have quit smoking within the past 15 years.  If you smoke heavily or if you used to smoke, you may need to be screened every year. Prostate cancer Prostate cancer screening is done with blood tests and an exam in which a health care provider uses a gloved finger to check prostate size (digital rectal exam). You may need to be screened for  prostate cancer if:  You have risk factors of prostate cancer, such as being African American or having a close family member with prostate cancer.  You have inherited gene changes or a genetic condition, including BRCA1 or BRCA2 gene mutations or Lynch syndrome.  You have symptoms of prostate cancer, such as problems urinating or erectile dysfunction.  Prostate cancer screening for men with average risk may start at age 107. Men with risk factors may need to be screened earlier at age 56-45. Once you have been screened for prostate cancer, future screening may be recommended based on the results of your blood tests. Colorectal cancer Screening for colorectal cancer is recommended starting at age 23 for most men. If you have a family history of colon or rectal cancer or other risk factors, you may need to start having screenings earlier. Talk with your health care provider about which screening test is right for you and how often you should be screened. Colorectal cancer screening looks for cancer or for growths called polyps that often form before cancer starts. Tests to look for cancer or polyps include:  Colonoscopy or flexible sigmoidoscopy. For these procedures, a flexible tube with a small camera is inserted into the rectum.  CT colonography. This test uses X-rays and a contrast dye to check the colon for polyps. If a polyp is found, you may need to have a colonoscopy so the polyp can be located and removed.  Tests to look for cancer in the stool (feces) include:  Guaiac-based fecal  occult blood test (FOBT). This test detects blood in stool. It can be done at home with a kit.  Fecal immunochemical test (FIT). This test detects blood in stool. For this test, you will need to collect stool samples at home.  Stool DNA test. This test looks for blood in stool and any changes in DNA that can lead to colon cancer. For this test, you will need to collect a stool sample at home and send it to a  lab.  Skin cancer Skin cancer screening is done by checking the skin for unusual moles or spots and any changes in existing moles. Your health care provider should check your skin for signs of skin cancer at every physical exam. You should check your skin every month and tell your health care provider right away if anything looks unusual. Men with a higher-than-normal risk for skin cancer may want to see a skin specialist (dermatologist) for an annual body check. Where to find more information:  [place]: SkinPromotion.no  Centers for Disease Control and Prevention: http://knight-sullivan.biz/  American Cancer Society: https://www.cancer.org/latest-news/4-cancer-screening-tests-for-men.html Contact a health care provider if:  You have concerns about any signs or symptoms of cancer, such as: ? Moles that have an unusual shape or color. ? Changes in existing moles. ? A sore on your skin that does not heal. ? Blood in your urine or stool. ? Fatigue that does not go away. ? Frequent pain or cramping in your abdomen. ? Coughing or trouble breathing that does not go away. ? Coughing up blood. ? Losing weight without trying. ? Changes in urination habits. ? Painful urination or ejaculation. Summary  Be aware of and watch for signs and symptoms of cancer, especially symptoms of lung cancer, prostate cancer, colorectal cancer, and skin cancer.  Early detection of cancer with cancer screening may save your life.  Talk with your health care provider about your specific cancer risks.  Work together with your health care provider to create a cancer screening plan that is right for you. This information is not intended to replace advice given to you by your health care provider. Make sure you discuss any questions you have with your health care provider. Document Released: 09/15/2015 Document Revised: 09/15/2015 Document  Reviewed: 09/15/2015 Elsevier Interactive Patient Education  [employer].  Colonoscopy, Adult A colonoscopy is an exam to look at the large intestine. It is done to check for problems, such as:  Lumps (tumors).  Growths (polyps).  Swelling (inflammation).  Bleeding.  What happens before the procedure? Eating and drinking Follow instructions from your doctor about eating and drinking. These instructions may include:  A few days before the procedure - follow a low-fiber diet. ? Avoid nuts. ? Avoid seeds. ? Avoid dried fruit. ? Avoid raw fruits. ? Avoid vegetables.  1-3 days before the procedure - follow a clear liquid diet. Avoid liquids that have red or purple dye. Drink only clear liquids, such as: ? Clear broth or bouillon. ? Black coffee or tea. ? Clear juice. ? Clear soft drinks or sports drinks. ? Gelatin dessert. ? Popsicles.  On the day of the procedure - do not eat or drink anything during the 2 hours before the procedure.  Bowel prep If you were prescribed an oral bowel prep:  Take it as told by your doctor. Starting the day before your procedure, you will need to drink a lot of liquid. The liquid will cause you to poop (have bowel movements) until your poop  is almost clear or light green.  If your skin or butt gets irritated from diarrhea, you may: ? Wipe the area with wipes that have medicine in them, such as adult wet wipes with aloe and vitamin E. ? Put something on your skin that soothes the area, such as petroleum jelly.  If you throw up (vomit) while drinking the bowel prep, take a break for up to 60 minutes. Then begin the bowel prep again. If you keep throwing up and you cannot take the bowel prep without throwing up, call your doctor.  General instructions  Ask your doctor about changing or stopping your normal medicines. This is important if you take diabetes medicines or blood thinners.  Plan to have someone take you home from the hospital  or clinic. What happens during the procedure?  An IV tube may be put into one of your veins.  You will be given medicine to help you relax (sedative).  To reduce your risk of infection: ? Your doctors will wash their hands. ? Your anal area will be washed with soap.  You will be asked to lie on your side with your knees bent.  Your doctor will get a long, thin, flexible tube ready. The tube will have a camera and a light on the end.  The tube will be put into your anus.  The tube will be gently put into your large intestine.  Air will be delivered into your large intestine to keep it open. You may feel some pressure or cramping.  The camera will be used to take photos.  A small tissue sample may be removed from your body to be looked at under a microscope (biopsy). If any possible problems are found, the tissue will be sent to a lab for testing.  If small growths are found, your doctor may remove them and have them checked for cancer.  The tube that was put into your anus will be slowly removed. The procedure may vary among doctors and hospitals. What happens after the procedure?  Your doctor will check on you often until the medicines you were given have worn off.  Do not drive for 24 hours after the procedure.  You may have a small amount of blood in your poop.  You may pass gas.  You may have mild cramps or bloating in your belly (abdomen).  It is up to you to get the results of your procedure. Ask your doctor, or the department performing the procedure, when your results will be ready. This information is not intended to replace advice given to you by your health care provider. Make sure you discuss any questions you have with your health care provider. Document Released: 01/20/2010 Document Revised: 10/19/2015 Document Reviewed: 03/01/2015 Elsevier Interactive Patient Education  2017 [place] Maintenance, Male A healthy lifestyle and preventive care  is important for your health and wellness. Ask your health care provider about what schedule of regular examinations is right for you. What should I know about weight and diet? Eat a Healthy Diet  Eat plenty of vegetables, fruits, whole grains, low-fat dairy products, and lean protein.  Do not eat a lot of foods high in solid fats, added sugars, or salt.  Maintain a Healthy Weight Regular exercise can help you achieve or maintain a healthy weight. You should:  Do at least 150 minutes of exercise each week. The exercise should increase your heart rate and make you sweat (moderate-intensity exercise).  Do strength-training exercises  at least twice a week.  Watch Your Levels of Cholesterol and Blood Lipids  Have your blood tested for lipids and cholesterol every 5 years starting at 70 years of age. If you are at high risk for heart disease, you should start having your blood tested when you are 70 years old. You may need to have your cholesterol levels checked more often if: ? Your lipid or cholesterol levels are high. ? You are older than 70 years of age. ? You are at high risk for heart disease.  What should I know about cancer screening? Many types of cancers can be detected early and may often be prevented. Lung Cancer  You should be screened every year for lung cancer if: ? You are a current smoker who has smoked for at least 30 years. ? You are a former smoker who has quit within the past 15 years.  Talk to your health care provider about your screening options, when you should start screening, and how often you should be screened.  Colorectal Cancer  Routine colorectal cancer screening usually begins at 70 years of age and should be repeated every 5-10 years until you are 70 years old. You may need to be screened more often if early forms of precancerous polyps or small growths are found. Your health care provider may recommend screening at an earlier age if you have risk  factors for colon cancer.  Your health care provider may recommend using home test kits to check for hidden blood in the stool.  A small camera at the end of a tube can be used to examine your colon (sigmoidoscopy or colonoscopy). This checks for the earliest forms of colorectal cancer.  Prostate and Testicular Cancer  Depending on your age and overall health, your health care provider may do certain tests to screen for prostate and testicular cancer.  Talk to your health care provider about any symptoms or concerns you have about testicular or prostate cancer.  Skin Cancer  Check your skin from head to toe regularly.  Tell your health care provider about any new moles or changes in moles, especially if: ? There is a change in a mole's size, shape, or color. ? You have a mole that is larger than a pencil eraser.  Always use sunscreen. Apply sunscreen liberally and repeat throughout the day.  Protect yourself by wearing long sleeves, pants, a wide-brimmed hat, and sunglasses when outside.  What should I know about heart disease, diabetes, and high blood pressure?  If you are 62-42 years of age, have your blood pressure checked every 3-5 years. If you are 74 years of age or older, have your blood pressure checked every year. You should have your blood pressure measured twice-once when you are at a hospital or clinic, and once when you are not at a hospital or clinic. Record the average of the two measurements. To check your blood pressure when you are not at a hospital or clinic, you can use: ? An automated blood pressure machine at a pharmacy. ? A home blood pressure monitor.  Talk to your health care provider about your target blood pressure.  If you are between 76-62 years old, ask your health care provider if you should take aspirin to prevent heart disease.  Have regular diabetes screenings by checking your fasting blood sugar level. ? If you are at a normal weight and have a  low risk for diabetes, have this test once every three years after the  age of 15. ? If you are overweight and have a high risk for diabetes, consider being tested at a younger age or more often.  A one-time screening for abdominal aortic aneurysm (AAA) by ultrasound is recommended for men aged 50-75 years who are current or former smokers. What should I know about preventing infection? Hepatitis B If you have a higher risk for hepatitis B, you should be screened for this virus. Talk with your health care provider to find out if you are at risk for hepatitis B infection. Hepatitis C Blood testing is recommended for:  Everyone born from [birth year] through 1963.  Anyone with known risk factors for hepatitis C.  Sexually Transmitted Diseases (STDs)  You should be screened each year for STDs including gonorrhea and chlamydia if: ? You are sexually active and are younger than 70 years of age. ? You are older than 70 years of age and your health care provider tells you that you are at risk for this type of infection. ? Your sexual activity has changed since you were last screened and you are at an increased risk for chlamydia or gonorrhea. Ask your health care provider if you are at risk.  Talk with your health care provider about whether you are at high risk of being infected with HIV. Your health care provider may recommend a prescription medicine to help prevent HIV infection.  What else can I do?  Schedule regular health, dental, and eye exams.  Stay current with your vaccines (immunizations).  Do not use any tobacco products, such as cigarettes, chewing tobacco, and e-cigarettes. If you need help quitting, ask your health care provider.  Limit alcohol intake to no more than 2 drinks per day. One drink equals 12 ounces of beer, 5 ounces of wine, or 1 ounces of hard liquor.  Do not use street drugs.  Do not share needles.  Ask your health care provider for help if you need support or  information about quitting drugs.  Tell your health care provider if you often feel depressed.  Tell your health care provider if you have ever been abused or do not feel safe at home. This information is not intended to replace advice given to you by your health care provider. Make sure you discuss any questions you have with your health care provider. Document Released: 06/16/2007 Document Revised: 08/17/2015 Document Reviewed: 09/21/2014 Elsevier Interactive Patient Education  2018 [employer].  Preventing Disease Through Immunization Immunization means developing a lower risk of getting a disease due to improvements in the body's disease-fighting system (immune system). Immunization can happen through:  Natural exposure to a disease.  Getting shots (vaccination).  Vaccination involves putting a small amount of germs (vaccines) into the body. This may be done through one or more shots. Some vaccines can be given by mouth or as a nasal spray, instead of a shot. Vaccination helps to prevent:  Serious diseases such as polio, measles, and whooping cough.  Common infections, such as the flu.  Vaccination starts at birth. Teens and adults also need vaccines regularly. Talk with your health care provider about the immunization schedule that is best for you. Some vaccines need to be repeated when you are older. How does immunization prevent disease? Immunization occurs when the body is exposed to germs that cause a certain disease. The body responds to this exposure by forming proteins (antibodies) to fight those germs. Germs in vaccines are dead or very weak, so they will not make you sick.  However, the antibodies that your body makes will stay in your body for a long time. This improves the ability of your immune system to fight the germs in the future. If you get exposed to the germs again, you may be able to resist them (develop immunity against them). This is because your antibodies may be  able to destroy the germs before you get sick. Why should I prevent diseases through immunization? Vaccines can protect you from getting diseases that can cause harmful complications and even death. Getting vaccinated also helps to keep other people healthy. If you are vaccinated, you cannot spread disease to others, and that can make the disease become less common. If people keep getting vaccinated, certain diseases may become rare or go away. If people stop getting vaccinated, certain diseases could become more common. Not everyone can get a vaccine. Very young babies, people who are very sick, or older people may not be able to get vaccines. By getting immunized, you help to protect people who are not able to be vaccinated. Where to find more information: To learn more about immunization, visit:  World Health Organization: https://www.davis-walter.com/  Centers for Disease Control and Prevention: http://www.murphy.com/  Summary  Immunization occurs when the body is exposed to germs that cause a certain disease and responds by forming proteins (antibodies) to fight those germs.  Getting vaccines is a safe and effective way to develop immunity against specific germs and the diseases that they cause.  Talk with your health care provider about your immunization schedule, and stay up to date with all of your shots. This information is not intended to replace advice given to you by your health care provider. Make sure you discuss any questions you have with your health care provider. Document Released: 02/09/2015 Document Revised: 08/27/2015 Document Reviewed: 08/27/2015 Elsevier Interactive Patient Education  [employer].

## 2017-05-03 DIAGNOSIS — R972 Elevated prostate specific antigen [PSA]: Secondary | ICD-10-CM | POA: Insufficient documentation

## 2017-05-03 NOTE — Addendum Note (Signed)
Addended by: Andrez Grime on: 05/03/2017 04:51 PM   Modules accepted: Orders

## 2017-05-06 ENCOUNTER — Telehealth: Payer: Self-pay

## 2017-05-06 NOTE — Telephone Encounter (Signed)
Copied from CRM [id number]. Topic: General - Other >> May 03, 2017  3:47 PM Eston Mould B wrote: Reason for CRM:  Pt called because his wife was upset after seeing his  avs from his office visit yesterday,  he doesn't think the STD  information should be on AVS

## 2017-05-07 NOTE — Telephone Encounter (Signed)
Spoke with patient & his spouse regarding the information provided on the AVS & addressed any further concerns. Both parties verbalized understanding. Prior to call ending, patient's wife thanked [employer] fundraiser for reaching out & clarifying this information.

## 2017-07-25 ENCOUNTER — Encounter: Payer: Self-pay | Admitting: Internal Medicine

## 2017-07-25 ENCOUNTER — Ambulatory Visit (INDEPENDENT_AMBULATORY_CARE_PROVIDER_SITE_OTHER): Payer: Medicare Other | Admitting: Internal Medicine

## 2017-07-25 VITALS — BP 120/78 | HR 71 | Ht 66.0 in | Wt 187.4 lb

## 2017-07-25 DIAGNOSIS — J42 Unspecified chronic bronchitis: Secondary | ICD-10-CM

## 2017-07-25 DIAGNOSIS — Z23 Encounter for immunization: Secondary | ICD-10-CM

## 2017-07-25 LAB — NITRIC OXIDE: Nitric Oxide: 37

## 2017-07-25 MED ORDER — FLUTICASONE FUROATE 100 MCG/ACT IN AEPB: 1.0000 | INHALATION_SPRAY | Freq: Every day | RESPIRATORY_TRACT | 5 refills | Status: DC

## 2017-07-25 NOTE — Patient Instructions (Addendum)
    ICD-10-CM   1. Chronic bronchitis, unspecified chronic bronchitis type (HCC) J42    Borderline elevated nitric oxide tests suggests asthma like inflammation  Plan Start arnuity low dose - take it daily  Albuterol and nebs as needed Pneumovax vaccine 07/25/2017 (CMA please check with him) Flu shot in fall  Followup 4-6 months or sooner if needed

## 2017-07-25 NOTE — Addendum Note (Signed)
Addended by: Wyvonne LenzPINION, Mar Walmer P on: 07/25/2017 10:39 AM   Modules accepted: Orders

## 2017-07-25 NOTE — Progress Notes (Signed)
Subjective:     Patient ID: Tyler Tanner, male   DOB: 1945-08-22, 72 y.o.   MRN: [id number]  HPI PCP Clovis Riley, L.August Saucer, MD   HPI  Tyler Tanner 02/13/2017  Chief Complaint  Patient presents with  . Advice Only    Self referral for wheezing and SOB which has been happening x1 month now. Pt states he also has complaints of hoarseness, cough with clear to yellowish green mucus and chest tightness.    72 year old male who says he is extremely active.  He quit smoking over 40 years ago and only has a 12 pack smoking history.  He says that approximately 1 month ago he started having sinus congestion and sore throat and bronchitis symptoms.  Since then he has had significant shortness of breath and hoarse voice.  He is also lost 3-4 pounds of weight unintentionally.  He says that he normally would walk 15-30 minutes daily.  When he does this same walking he gets very short of breath and he will have to follow through.  He still has cough.  Exam nitric oxide today is only 38 ppb and is borderline and indeterminate for eosinophilic asthma.  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  This is followed up with a CT scan of the chest that shows the same.  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  He does have some mild coronary artery calcification but has never had bypass surgery.  He did get antibiotics a month ago for a week but these have not helped.  Did get one steroid shot but this is not helped. No hx of regular chiropractic manipulations  feNO 02/13/2017 : 38 ppb and bordreline  Results for Tyler Tanner, Tyler Tanner (MRN [id number]) as of 02/13/2017 11:43  Ref. Range 01/27/2017 13:57  Eosinophils Absolute Latest Ref Range: 0.0 - 0.7 K/uL 0.1    Walking desaturation test on 02/13/2017 185 feet x 3 laps on ROOM AIR:  did not desaturate. Rest pulse ox was 97%, final pulse ox was 99%.  HR response 82/min at rest to 94/min at peak exertion. Patient Tyler Tanner  Did not Desaturate < 88% . Tyler Tanner did not  Desaturated </= 3% points. Tyler Tanner did yes get tachyardic. Did this in moderate pace and did get dyspneic    has a past medical history of GERD (gastroesophageal reflux disease).   reports that he quit smoking about 41 years ago. His smoking use included cigarettes. He has a 12.00 pack-year smoking history. he has never used smokeless tobacco.  OV 04/03/2017  Chief Complaint  Patient presents with  . Follow-up    vomiting and diarrhea last night, unsure what caused it, breo makes him cough and feel weak.     Tyler Tanner presents for follow-up.  He underwent sniffed test and it is confirmed that he has a paralyzed left hemidiaphragm.  For the sinus issues he saw Dr. Jearld Fenton.  I reviewed the note he was given Augmentin and this helped her sinus issues.  However he tells me that he continues to have significant cough and congestion and white sputum.  He feels the Brio that I gave him did not help him at all.  In fact it made him cough more and made him feel weak.  Therefore he stopped it after a week.  He is frustrated  by his symptoms.  He does not think any of his symptoms including chest tightness are related to the paralyzed left diaphragm.  He continues to insist that he is quit smoking although I could smell tobacco on his clothes.  He says he would prefer nebulizers because these have helped him in the past.  He says albuterol inhaler helps a lot   OV 07/25/2017  Chief Complaint  Patient presents with  . Follow-up    Pt states he is doing better since last visit. States he is still coughing at night and states he still has some chest tightness that is worse at night.     Tyler Tanner , 72 y.o. , with dob 1945-10-10 and male ,Not Hispanic or Latino from [address] [place] [address] - presents to lung  clinic for chronic cough / cronch  bronchitis. This is a routine follow-up. Last visit April 2019. He has associated paralyzed hemidiaphragm unilaterally. He says overall his cough is better but his RSI cough score is 20.He is not taking this DuoNeb 4 times daily but only takes it as needed and of unclear frequency. He says he is continuing his Flonase. There are no other new issues or medical problems. But he does feel his cough is improved. Last CT scan of the chest in 2019 with just paralyzed hemidiaphragm on the right side but otherwise clear lungs. feno 37ppb and high intermediate range- similar to before.Marland Kitchen He does not like breo - made him dizzy but willing to try arnuity.     Dr Gretta Cool Reflux Symptom Index (> 13-15 suggestive of LPR cough)  07/25/2017   Hoarseness of problem with voice 3  Clearing  Of Throat 5  Excess throat mucus or feeling of post nasal drip 3  Difficulty swallowing food, liquid or tablets 0  Cough after eating or lying down 3  Breathing difficulties or choking episodes 3  Troublesome or annoying cough 3  Sensation of something sticking in throat or lump in throat 0  Heartburn, chest pain, indigestion, or stomach acid coming up 0  TOTAL 20        has a past medical history of GERD (gastroesophageal reflux disease).   reports that he quit smoking about 42 years ago. His smoking use included cigarettes. He has a 12.00 pack-year smoking history. He has never used smokeless tobacco.  Past Surgical History:  Procedure Laterality Date  . TONSILLECTOMY      No Known Allergies  Immunization History  Administered Date(s) Administered  . Pneumococcal Conjugate-13 05/24/2015  . Td 04/01/2007    Family History  Problem Relation Age of Onset  . Diabetes Mother   . Coronary artery disease Brother      Current Outpatient Medications:  .  fluticasone (FLONASE) 50 MCG/ACT nasal spray, Place 2 sprays into both nostrils daily., Disp: 16 g, Rfl: 6 .  ibuprofen (ADVIL,MOTRIN) 800 MG tablet, Take  800 mg by mouth every 8 (eight) hours as needed for moderate pain. , Disp: , Rfl:  .  ipratropium-albuterol (DUONEB) 0.5-2.5 (3) MG/3ML SOLN, Take 3 mLs by nebulization 4 (four) times daily as needed., Disp: 360 mL, Rfl: 12 .  PROAIR HFA 108 (90 Base) MCG/ACT inhaler, Inhale 2 puffs into the lungs every 6 (six) hours as needed., Disp: , Rfl: 0 .  tamsulosin (FLOMAX) 0.4 MG CAPS capsule, Take 1 capsule (0.4 mg total) by mouth daily after supper., Disp: 14 capsule, Rfl: 0    Review of Systems  Objective:   Physical Exam  Constitutional: He is oriented to person, place, and time. He appears well-developed and well-nourished. No distress.  HENT:  Head: Normocephalic and atraumatic.  Right Ear: External ear normal.  Left Ear: External ear normal.  Mouth/Throat: Oropharynx is clear and moist. No oropharyngeal exudate.  Eyes: Pupils are equal, round, and reactive to light. Conjunctivae and EOM are normal. Right eye exhibits no discharge. Left eye exhibits no discharge. No scleral icterus.  Neck: Normal range of motion. Neck supple. No JVD present. No tracheal deviation present. No thyromegaly present.  Cardiovascular: Normal rate, regular rhythm and intact distal pulses. Exam reveals no gallop and no friction rub.  No murmur heard. Pulmonary/Chest: Effort normal and breath sounds normal. No respiratory distress. He has no wheezes. He has no rales. He exhibits no tenderness.  Abdominal: Soft. Bowel sounds are normal. He exhibits no distension and no mass. There is no tenderness. There is no rebound and no guarding.  Musculoskeletal: Normal range of motion. He exhibits no edema or tenderness.  Lymphadenopathy:    He has no cervical adenopathy.  Neurological: He is alert and oriented to person, place, and time. He has normal reflexes. No cranial nerve deficit. Coordination normal.  Skin: Skin is warm and dry. No rash noted. He is not diaphoretic. No erythema. No pallor.  Psychiatric: He has a  normal mood and affect. His behavior is normal. Judgment and thought content normal.  Nursing note and vitals reviewed.  Vitals:   07/25/17 0950  BP: 120/78  Pulse: 71  SpO2: 98%  Weight: 187 lb 6.4 oz (85 kg)  Height: 5\' 6"  (1.676 m)    Estimated body mass index is 30.25 kg/m as calculated from the following:   Height as of this encounter: 5\' 6"  (1.676 m).   Weight as of this encounter: 187 lb 6.4 oz (85 kg).     Assessment:       ICD-10-CM   1. Chronic bronchitis, unspecified chronic bronchitis type (HCC) J42 Nitric oxide       Plan:      Borderline elevated nitric oxide tests suggests asthma like inflammation  Plan Start arnuity low dose - take it daily  Albuterol and nebs as needed Pneumovax vaccine 07/25/2017 (CMA please check with him) Flu shot in fall  Followup 4-6 months or sooner if needed    Dr. Kalman ShanMurali Ronie Fleeger, M.D., [facility] Inc[facility].C.C.P Pulmonary and Critical Care Medicine Staff Physician, [facility] Center Director - Interstitial Lung Disease  Program  Pulmonary Fibrosis [facility] [place], [place], [id number]  Pager: [phone], If no answer or between  15:00h - 7:00h: call [phone] Telephone: [phone]

## 2017-10-23 ENCOUNTER — Encounter: Payer: Self-pay | Admitting: Pulmonary Disease

## 2017-10-23 ENCOUNTER — Ambulatory Visit (INDEPENDENT_AMBULATORY_CARE_PROVIDER_SITE_OTHER): Payer: Medicare Other | Admitting: Pulmonary Disease

## 2017-10-23 VITALS — BP 120/68 | HR 80 | Temp 97.8°F | Ht 66.0 in | Wt 189.0 lb

## 2017-10-23 DIAGNOSIS — R05 Cough: Secondary | ICD-10-CM

## 2017-10-23 DIAGNOSIS — J209 Acute bronchitis, unspecified: Secondary | ICD-10-CM

## 2017-10-23 DIAGNOSIS — J4541 Moderate persistent asthma with (acute) exacerbation: Secondary | ICD-10-CM | POA: Diagnosis not present

## 2017-10-23 DIAGNOSIS — R059 Cough, unspecified: Secondary | ICD-10-CM

## 2017-10-23 LAB — NITRIC OXIDE: Nitric Oxide: 17

## 2017-10-23 MED ORDER — AZITHROMYCIN 250 MG PO TABS: ORAL_TABLET | ORAL | 0 refills | Status: DC

## 2017-10-23 MED ORDER — PREDNISONE 10 MG PO TABS: ORAL_TABLET | ORAL | 0 refills | Status: DC

## 2017-10-23 NOTE — Assessment & Plan Note (Signed)
FeNO today >>>improved today - 13  Azithromycin 250mg  tablet  >>>Take 2 tablets (500mg  total) today, and then 1 tablet (250mg ) for the next four days  >>>take with food  >>>can also take probiotic and / or yogurt while on antibiotic   Prednisone 10mg  tablet  >>>4 tabs for 2 days, then 3 tabs for 2 days, 2 tabs for 2 days, then 1 tab for 2 days, then stop >>>take with food  >>>take in the morning   Continue Arnuity Ellipta  Follow-up with Dr. Marchelle Gearing in 2 to 3 months  Contact our office if symptoms are not improving   Refused flu vaccine today

## 2017-10-23 NOTE — Progress Notes (Signed)
@Patient  ID: Tyler Tanner, male    DOB: 01-06-1945, 72 y.o.   MRN: [id number]  Chief Complaint  Patient presents with  . Acute Visit    Cough, more mucous    Referring provider: Mliss Sax  HPI:  72 year old male former smoker followed in our office for Asthma  PMH: Osteoarthritis, low back pain  Smoker/ Smoking History: Former Smoker  Maintenance:  Arnuity Ellipta  Pt of: Dr. Marchelle Gearing  Recent  Pulmonary Encounters:   07/25/17 - OV - MR  Borderline elevated nitric oxide tests suggests asthma like inflammation Plan: Start arnuity low dose - take it daily, Albuterol and nebs as needed, Pneumovax vaccine 07/25/2017 (CMA please check with him), Flu shot in fall, Follow up: 4-6 months or sooner if needed  10/23/2017  - Visit   72 year old male patient presenting today for acute visit.  Patient reports that he had 2 weeks of increased cough at night as well as occasional shortness of breath.  Patient also reporting productive white mucus.  Patient denies fevers or chills.  Patient reports adherence to his Arnuity Ellipta.  Patient denies dyspnea with exertion.  Patient does report that cough and symptoms tend to worsen at night.  Patient reports that he had occasional GERD and acid reflux and took 2 weeks of Nexium as stopped.  Patient reports that his acid reflux has not occurred in the last 2 weeks.  Patient does not believe that he needs to take chronic therapies for management of his acid reflux.  Patient refuses flu vaccine today     Tests:  02/18/2017-sniff test- paralysis of left diaphragm 01/27/2017-CT chest without contrast- Medial RIGHT lower lobe scarring with subsegmental atelectasis at LEFT base.  FENO:  Lab Results  Component Value Date   NITRICOXIDE 17 10/23/2017    PFT: PFT Results Latest Ref Rng & Units 04/03/2017  FVC-Pre L 2.54  FVC-Predicted Pre % 64  FVC-Post L 2.92  FVC-Predicted Post % 74  Pre FEV1/FVC % % 69  Post FEV1/FCV % % 74   FEV1-Pre L 1.74  FEV1-Predicted Pre % 60  DLCO UNC% % 67  DLCO COR %Predicted % 88  TLC L 4.82  TLC % Predicted % 75  RV % Predicted % 93    Imaging: No results found.  Chart Review:    Specialty Problems      Pulmonary Problems   SYMPTOM, COUGH    Qualifier: Diagnosis of  By: Tawanna Cooler MD, Eugenio Hoes       Bronchitis, acute    Qualifier: Diagnosis of  By: Amador Cunas  MD, Janett Labella       COPD    Qualifier: Diagnosis of  By: Amador Cunas  MD, Janett Labella       Nasal congestion due to prolonged use of decongestants   Reactive airway disease      No Known Allergies  Immunization History  Administered Date(s) Administered  . Pneumococcal Conjugate-13 05/24/2015  . Pneumococcal Polysaccharide-23 07/25/2017  . Td 04/01/2007   Patient refused flu vaccine today  Past Medical History:  Diagnosis Date  . GERD (gastroesophageal reflux disease)     Tobacco History: Social History   Tobacco Use  Smoking Status Former Smoker  . Packs/day: 1.00  . Years: 12.00  . Pack years: 12.00  . Types: Cigarettes  . Last attempt to quit: 07/25/1975  . Years since quitting: 42.2  Smokeless Tobacco Never Used   Counseling given: Yes  Continue to not smoke  Outpatient  Encounter Medications as of 10/23/2017  Medication Sig  . fluticasone (FLONASE) 50 MCG/ACT nasal spray Place 2 sprays into both nostrils daily.  . Fluticasone Furoate (ARNUITY ELLIPTA) 100 MCG/ACT AEPB Inhale 1 puff into the lungs daily.  Marland Kitchen ibuprofen (ADVIL,MOTRIN) 800 MG tablet Take 800 mg by mouth every 8 (eight) hours as needed for moderate pain.   Marland Kitchen ipratropium-albuterol (DUONEB) 0.5-2.5 (3) MG/3ML SOLN Take 3 mLs by nebulization 4 (four) times daily as needed.  Marland Kitchen PROAIR HFA 108 (90 Base) MCG/ACT inhaler Inhale 2 puffs into the lungs every 6 (six) hours as needed.  . tamsulosin (FLOMAX) 0.4 MG CAPS capsule Take 1 capsule (0.4 mg total) by mouth daily after supper.  Marland Kitchen azithromycin (ZITHROMAX) 250 MG tablet  500mg  (two tablets) today, then 250mg  (1 tablet) for the next 4 days  . predniSONE (DELTASONE) 10 MG tablet 4 tabs for 2 days, then 3 tabs for 2 days, 2 tabs for 2 days, then 1 tab for 2 days, then stop   No facility-administered encounter medications on file as of 10/23/2017.      Review of Systems  Review of Systems  Constitutional: Positive for fatigue. Negative for activity change, chills, fever and unexpected weight change.  HENT: Positive for congestion and postnasal drip. Negative for rhinorrhea, sinus pressure, sinus pain, sneezing and sore throat.   Eyes: Negative.   Respiratory: Positive for cough and wheezing. Negative for shortness of breath.   Cardiovascular: Negative for chest pain and palpitations.  Gastrointestinal: Negative for constipation, diarrhea, nausea and vomiting.       Heartburn indigestion - resolved with nexium, hasnt taken in 2 weeks   Endocrine: Negative.   Musculoskeletal: Negative.   Skin: Negative.   Neurological: Negative for dizziness and headaches.  Psychiatric/Behavioral: Positive for sleep disturbance. Negative for dysphoric mood. The patient is not nervous/anxious.   All other systems reviewed and are negative.    Physical Exam  BP 120/68 (BP Location: Left Arm, Cuff Size: Normal)   Pulse 80   Temp 97.8 F (36.6 C) (Oral)   Ht 5\' 6"  (1.676 m)   Wt 189 lb (85.7 kg)   SpO2 96%   BMI 30.51 kg/m   Wt Readings from Last 5 Encounters:  10/23/17 189 lb (85.7 kg)  07/25/17 187 lb 6.4 oz (85 kg)  05/02/17 186 lb 6 oz (84.5 kg)  04/03/17 187 lb (84.8 kg)  03/27/17 187 lb 6.4 oz (85 kg)    Physical Exam  Constitutional: He is oriented to person, place, and time and well-developed, well-nourished, and in no distress. No distress.  HENT:  Head: Normocephalic and atraumatic.  Right Ear: Hearing, tympanic membrane, external ear and ear canal normal.  Left Ear: Hearing, tympanic membrane, external ear and ear canal normal.  Nose: Nose normal.  Right sinus exhibits no maxillary sinus tenderness and no frontal sinus tenderness. Left sinus exhibits no maxillary sinus tenderness and no frontal sinus tenderness.  Mouth/Throat: Uvula is midline and oropharynx is clear and moist. No oropharyngeal exudate.  Eyes: Pupils are equal, round, and reactive to light.  Neck: Normal range of motion. Neck supple. No JVD present.  Cardiovascular: Normal rate, regular rhythm and normal heart sounds.  Pulmonary/Chest: Effort normal. No accessory muscle usage. No respiratory distress. He has no decreased breath sounds. He has wheezes (slight exertional wheeze). He has no rhonchi.  Musculoskeletal: Normal range of motion. He exhibits no edema.  Lymphadenopathy:    He has no cervical adenopathy.  Neurological: He is alert  and oriented to person, place, and time. Gait normal.  Skin: Skin is warm and dry. He is not diaphoretic. No erythema.  Psychiatric: Mood, memory, affect and judgment normal.  Nursing note and vitals reviewed.     Lab Results:  CBC    Component Value Date/Time   WBC 4.3 05/02/2017 0946   RBC 5.19 05/02/2017 0946   HGB 16.0 05/02/2017 0946   HCT 46.5 05/02/2017 0946   PLT 204.0 05/02/2017 0946   MCV 89.7 05/02/2017 0946   MCH 30.7 01/27/2017 1357   MCHC 34.3 05/02/2017 0946   RDW 13.5 05/02/2017 0946   LYMPHSABS 2.7 01/27/2017 1357   MONOABS 0.6 01/27/2017 1357   EOSABS 0.1 01/27/2017 1357   BASOSABS 0.0 01/27/2017 1357    BMET    Component Value Date/Time   NA 138 05/02/2017 0946   K 4.1 05/02/2017 0946   CL 104 05/02/2017 0946   CO2 26 05/02/2017 0946   GLUCOSE 94 05/02/2017 0946   BUN 12 05/02/2017 0946   CREATININE 1.10 05/02/2017 0946   CALCIUM 9.1 05/02/2017 0946   GFRNONAA 59 (L) 01/27/2017 1357   GFRAA >60 01/27/2017 1357    BNP No results found for: BNP  ProBNP No results found for: PROBNP    Assessment & Plan:   72 year old male patient presenting today with increased acute symptoms. Will  treat as Bronchitis with zpak and short course of prednisone. WIll have patient follow up with Dr. Marchelle Gearing in 2-3 months. Pt knows to follow up sooner if symptoms worsen.   Reactive airway disease FeNO today >>>improved today - 13  Azithromycin 250mg  tablet  >>>Take 2 tablets (500mg  total) today, and then 1 tablet (250mg ) for the next four days  >>>take with food  >>>can also take probiotic and / or yogurt while on antibiotic   Prednisone 10mg  tablet  >>>4 tabs for 2 days, then 3 tabs for 2 days, 2 tabs for 2 days, then 1 tab for 2 days, then stop >>>take with food  >>>take in the morning   Continue Arnuity Ellipta  Follow-up with Dr. Marchelle Gearing in 2 to 3 months  Contact our office if symptoms are not improving   Refused flu vaccine today    Bronchitis, acute FeNO today  Azithromycin 250mg  tablet  >>>Take 2 tablets (500mg  total) today, and then 1 tablet (250mg ) for the next four days  >>>take with food  >>>can also take probiotic and / or yogurt while on antibiotic   Prednisone 10mg  tablet  >>>4 tabs for 2 days, then 3 tabs for 2 days, 2 tabs for 2 days, then 1 tab for 2 days, then stop >>>take with food  >>>take in the morning   Continue Arnuity Ellipta  Follow-up with Dr. Marchelle Gearing in 2 to 3 months  Contact our office if symptoms are not improving  He refused flu vaccine today       Coral Ceo, NP 10/23/2017

## 2017-10-23 NOTE — Progress Notes (Signed)
Discussed results with patient in office.  Nothing further is needed at this time.  Brian Mack FNP  

## 2017-10-23 NOTE — Assessment & Plan Note (Signed)
FeNO today  Azithromycin 250mg  tablet  >>>Take 2 tablets (500mg  total) today, and then 1 tablet (250mg ) for the next four days  >>>take with food  >>>can also take probiotic and / or yogurt while on antibiotic   Prednisone 10mg  tablet  >>>4 tabs for 2 days, then 3 tabs for 2 days, 2 tabs for 2 days, then 1 tab for 2 days, then stop >>>take with food  >>>take in the morning   Continue Arnuity Ellipta  Follow-up with Dr. Marchelle Gearing in 2 to 3 months  Contact our office if symptoms are not improving  He refused flu vaccine today

## 2017-10-23 NOTE — Patient Instructions (Addendum)
FeNO today  Azithromycin 250mg  tablet  >>>Take 2 tablets (500mg  total) today, and then 1 tablet (250mg ) for the next four days  >>>take with food  >>>can also take probiotic and / or yogurt while on antibiotic   Prednisone 10mg  tablet  >>>4 tabs for 2 days, then 3 tabs for 2 days, 2 tabs for 2 days, then 1 tab for 2 days, then stop >>>take with food  >>>take in the morning   Continue Arnuity Ellipta  Follow-up with Dr. Marchelle Gearing in 2 to 3 months  Contact our office if symptoms are not improving   He refused flu vaccine today    November/2019 we will be moving! We will no longer be at our [place] location.  Be on the look out for a post card/mailer to let you know we have officially moved.  Our new address and phone number will be:  [address] [place], [place] [address] Telephone number: [phone]  It is flu season:   >>>Remember to be washing your hands regularly, using hand sanitizer, be careful to use around herself with has contact with people who are sick will increase her chances of getting sick yourself. >>> Best ways to protect herself from the flu: Receive the yearly flu vaccine, practice good hand hygiene washing with soap and also using hand sanitizer when available, eat a nutritious meals, get adequate rest, hydrate appropriately   Please contact the office if your symptoms worsen or you have concerns that you are not improving.   Thank you for choosing [facility] for your healthcare, and for allowing [place] to partner with you on your healthcare journey. I am thankful to be able to provide care to you today.   Elisha Headland FNP-C

## 2017-11-20 ENCOUNTER — Ambulatory Visit (INDEPENDENT_AMBULATORY_CARE_PROVIDER_SITE_OTHER): Payer: Medicare Other | Admitting: Nurse Practitioner

## 2017-11-20 ENCOUNTER — Encounter: Payer: Self-pay | Admitting: Nurse Practitioner

## 2017-11-20 VITALS — BP 138/70 | HR 80 | Ht 66.0 in | Wt 189.4 lb

## 2017-11-20 DIAGNOSIS — J45901 Unspecified asthma with (acute) exacerbation: Secondary | ICD-10-CM

## 2017-11-20 DIAGNOSIS — R059 Cough, unspecified: Secondary | ICD-10-CM

## 2017-11-20 DIAGNOSIS — R05 Cough: Secondary | ICD-10-CM | POA: Diagnosis not present

## 2017-11-20 MED ORDER — BENZONATATE 100 MG PO CAPS: 100.0000 mg | ORAL_CAPSULE | Freq: Three times a day (TID) | ORAL | 1 refills | Status: DC

## 2017-11-20 MED ORDER — PREDNISONE 10 MG PO TABS: ORAL_TABLET | ORAL | 0 refills | Status: DC

## 2017-11-20 NOTE — Progress Notes (Signed)
@Patient  ID: Tyler Tanner, male    DOB: [birth year], 72 y.o.   MRN: [id number]  Chief Complaint  Patient presents with  . Cough    Per patient has not improved since last office visit.    Referring provider: Mliss Sax,*  HPI  72 year old male former smoker followed for reactive airway disease by Dr. Marchelle Gearing.  Past medical history includes osteoarthritis, low back pain, former smoker. 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  PFT Results Latest Ref Rng & Units 04/03/2017  FVC-Pre L 2.54  FVC-Predicted Pre % 64  FVC-Post L 2.92  FVC-Predicted Post % 74  Pre FEV1/FVC % % 69  Post FEV1/FCV % % 74  FEV1-Pre L 1.74  FEV1-Predicted Pre % 60  FEV1-Post L 2.18  DLCO UNC% % 67  DLCO COR %Predicted % 88  TLC L 4.82  TLC % Predicted % 75  RV % Predicted % 93     OV 11/21/17 - Acute cough Patient presents with persistent cough.  He was last seen by Elisha Headland on 10/23/2017. He was treated for bronchitis and was prescribed azithromycin and a prednisone taper.  He states that symptoms have improved but have not completely resolved.  States that his cough started over one month ago.  States that symptoms are slowly resolving.  Cough is still productive of white mucus. He states that he has had to take more than one prednisone taper in the past before his symptoms completely resolved. He is compliant with Arnuity Ellipta. Patient denies dyspnea on exertion. He denies chest pain, fever, or edema.   Note: Patient is also requesting a "beyond" air purifier for his house. He has contacted his insurance agency and they stated that insurance would cover the air purifier because of his diagnosis of reactive airway disease.   Note: patient also brings a form for surgical clearance for up coming tooth extraction by dentist.  He is requesting that this be filled out today.    No Known Allergies  Immunization History  Administered Date(s) Administered  . Pneumococcal Conjugate-13 05/24/2015  . Pneumococcal Polysaccharide-23 07/25/2017  . Td 04/01/2007    Past Medical History:  Diagnosis Date  . GERD (gastroesophageal reflux disease)     Tobacco History: Social History   Tobacco Use  Smoking Status Former Smoker  . Packs/day: 1.00  . Years: 12.00  . Pack years: 12.00  . Types: Cigarettes  . Last attempt to quit: 07/25/1975  . Years since quitting: 42.3  Smokeless Tobacco Never Used   Counseling given: Yes   Outpatient Encounter Medications as of 11/20/2017  Medication Sig  . fluticasone (FLONASE) 50 MCG/ACT nasal spray Place 2 sprays into both nostrils daily.  . Fluticasone Furoate (ARNUITY ELLIPTA) 100 MCG/ACT AEPB Inhale 1 puff into the lungs daily.  . fluticasone furoate-vilanterol (BREO ELLIPTA) 100-25 MCG/INH AEPB Inhale into the lungs.  Marland Kitchen ibuprofen (ADVIL,MOTRIN) 800 MG tablet Take 800 mg by mouth every 8 (eight) hours as needed for moderate pain.   Marland Kitchen ipratropium-albuterol (DUONEB) 0.5-2.5 (3) MG/3ML SOLN Take 3 mLs by nebulization 4 (four) times daily as needed.  Marland Kitchen NEXIUM 20 MG capsule Take 1 tablet by mouth as needed.  Marland Kitchen PROAIR HFA 108 (90 Base) MCG/ACT inhaler Inhale 2 puffs into the lungs every 6 (six) hours as needed.  . tamsulosin (FLOMAX) 0.4 MG CAPS capsule Take 1 capsule (  0.4 mg total) by mouth daily after supper.  . [DISCONTINUED] azithromycin (ZITHROMAX) 250 MG tablet 500mg  (two tablets) today, then 250mg  (1 tablet) for the next 4 days  . benzonatate (TESSALON) 100 MG capsule Take 1 capsule (100 mg total) by mouth 3 (three) times daily.  . predniSONE (DELTASONE) 10 MG tablet Take 4 tabs for 2 days, then 3 tabs for 2 days, then 2 tabs for 2 days, then 1 tab for 2 days, then stop  . [DISCONTINUED] predniSONE (DELTASONE) 10 MG tablet 4 tabs for 2 days, then 3 tabs for 2 days, 2  tabs for 2 days, then 1 tab for 2 days, then stop (Patient not taking: Reported on 11/20/2017)   No facility-administered encounter medications on file as of 11/20/2017.      Review of Systems  Review of Systems  Constitutional: Negative.  Negative for chills and fever.  HENT: Negative.  Negative for congestion, sinus pressure and sinus pain.   Respiratory: Positive for cough. Negative for shortness of breath.   Cardiovascular: Negative.  Negative for chest pain, palpitations and leg swelling.  Gastrointestinal: Negative.   Allergic/Immunologic: Negative.   Neurological: Negative.   Psychiatric/Behavioral: Negative.        Physical Exam  BP 138/70 (BP Location: Left Arm, Patient Position: Sitting, Cuff Size: Normal)   Pulse 80   Ht 5\' 6"  (1.676 m)   Wt 189 lb 6.4 oz (85.9 kg)   SpO2 97%   BMI 30.57 kg/m   Wt Readings from Last 5 Encounters:  11/20/17 189 lb 6.4 oz (85.9 kg)  10/23/17 189 lb (85.7 kg)  07/25/17 187 lb 6.4 oz (85 kg)  05/02/17 186 lb 6 oz (84.5 kg)  04/03/17 187 lb (84.8 kg)     Physical Exam  Constitutional: He is oriented to person, place, and time. He appears well-developed and well-nourished. No distress.  HENT:  Nose: Right sinus exhibits no maxillary sinus tenderness and no frontal sinus tenderness. Left sinus exhibits no maxillary sinus tenderness and no frontal sinus tenderness.  Cardiovascular: Normal rate and regular rhythm.  Pulmonary/Chest: Effort normal and breath sounds normal. No respiratory distress. He has no wheezes. He has no rales.  Neurological: He is alert and oriented to person, place, and time.  Skin: Skin is warm and dry.  Psychiatric: He has a normal mood and affect.  Nursing note and vitals reviewed.      Assessment & Plan:   Reactive airway disease with acute exacerbation Exacerbation is slow to resolve Patient does seem to be improving slowly Will order another prednisone taper today A prescription for antibiotic  would not be indicated today  Patient Instructions  Chronic bronchitis slow to resolve Will order another round of prednisone Will order tessalon pearls Will give samples of mucinex Please place DME order for Beyond air purifier per patient request - for reactive airways disease  Patient brought form for surgery clearance for dental extraction Please wait til this current flare clears before procedure  May take inhalers day of procedure From pulmonary stand point patient would be moderate risk for procedure  Follow up with Dr. Marchelle Gearing in 3-4 weeks (first available) or sooner of needed       Cough Patient Instructions  Chronic bronchitis slow to resolve Will order another round of prednisone Will order tessalon pearls Will give samples of mucinex Please place DME order for Beyond air purifier per patient request - for reactive airways disease  Patient brought form for surgery clearance for dental  extraction Please wait til this current flare clears before procedure  May take inhalers day of procedure From pulmonary stand point patient would be moderate risk for procedure  Follow up with Dr. Marchelle Gearingamaswamy in 3-4 weeks (first available) or sooner of needed          Ivonne Andrewonya S Sybella Harnish, NP 11/21/2017

## 2017-11-20 NOTE — Patient Instructions (Addendum)
Chronic bronchitis slow to resolve Will order another round of prednisone Will order tessalon pearls Will give samples of mucinex Please place DME order for Beyond air purifier per patient request - for reactive airways disease  Patient brought form for surgery clearance for dental extraction Please wait til this current flare clears before procedure  May take inhalers day of procedure From pulmonary stand point patient would be moderate risk for procedure  Follow up with Dr. Marchelle Gearingamaswamy in 3-4 weeks (first available) or sooner of needed

## 2017-11-21 ENCOUNTER — Telehealth: Payer: Self-pay | Admitting: Nurse Practitioner

## 2017-11-21 ENCOUNTER — Encounter: Payer: Self-pay | Admitting: Nurse Practitioner

## 2017-11-21 DIAGNOSIS — J45901 Unspecified asthma with (acute) exacerbation: Secondary | ICD-10-CM | POA: Insufficient documentation

## 2017-11-21 NOTE — Telephone Encounter (Signed)
Order was placed for this to be provided for pt. Nothing further needed.

## 2017-11-21 NOTE — Addendum Note (Signed)
Addended by: Ander SladeFORESTER, Nitya Cauthon M on: 11/21/2017 05:00 PM   Modules accepted: Orders

## 2017-11-21 NOTE — Assessment & Plan Note (Addendum)
Exacerbation is slow to resolve Patient does seem to be improving slowly Will order another prednisone taper today A prescription for antibiotic would not be indicated today  Patient Instructions  Chronic bronchitis slow to resolve Will order another round of prednisone Will order tessalon pearls Will give samples of mucinex Please place DME order for Beyond air purifier per patient request - for reactive airways disease  Patient brought form for surgery clearance for dental extraction Please wait til this current flare clears before procedure  May take inhalers day of procedure From pulmonary stand point patient would be moderate risk for procedure  Follow up with Dr. Marchelle Gearingamaswamy in 3-4 weeks (first available) or sooner of needed

## 2017-11-21 NOTE — Telephone Encounter (Signed)
Yes. I did want the order placed. Thanks.

## 2017-11-21 NOTE — Telephone Encounter (Signed)
Looked at pt's OV from yesterday, 11/20/17 that pt had with [facility]. I do not see where an order was placed for pt to receive an air purifer from DME.    Spoke with Cecille Averana Forrester to see if she knew anything about this and she stated she was not made known for this to be done.  Tonya, please advise if an order was supposed to have been placed for this to be done for pt from yesterday's OV and if so, Annabelle HarmanDana stated to route this to her so she can take care of it for pt. Thanks!

## 2017-11-21 NOTE — Assessment & Plan Note (Signed)
Patient Instructions  Chronic bronchitis slow to resolve Will order another round of prednisone Will order tessalon pearls Will give samples of mucinex Please place DME order for Beyond air purifier per patient request - for reactive airways disease  Patient brought form for surgery clearance for dental extraction Please wait til this current flare clears before procedure  May take inhalers day of procedure From pulmonary stand point patient would be moderate risk for procedure  Follow up with Dr. Marchelle Gearingamaswamy in 3-4 weeks (first available) or sooner of needed

## 2017-11-22 ENCOUNTER — Telehealth: Payer: Self-pay | Admitting: Nurse Practitioner

## 2017-11-22 NOTE — Telephone Encounter (Signed)
Tyler BoatmanBrad [place] [phone] [employer] ) called back; he saw the patient, and the pt told him that his insurance will be able to cover air pruifier; stated doctor was ordering the air purifier.Tyler Tanner. Tyler states normally the patient pay out of the pocket, and the insurance reimburse, but patient is stating the doctor has placed the order.

## 2017-11-22 NOTE — Telephone Encounter (Signed)
Order has been faxed I have called Nida BoatmanBrad they do not file insurance usually the patient pays up front so he will be contacting the patient

## 2017-11-22 NOTE — Telephone Encounter (Signed)
This order had not been  Sent yet due to no signature but I will send it over to this company now

## 2017-11-22 NOTE — Telephone Encounter (Signed)
Called and spoke with Tyler Tanner from [employer]. Tyler Tanner stated that he is the one who has been taking care of pt in regards trying to get the [employer] for him. [facility] for pt to be provided with a "Beyond" air purifier and the order was sent to [employer].  Tyler Tanner stated that is the wrong company that the order was to be sent to. Per Tyler Tanner, the order needs to be sent directly to [employer]. Phone number for the office is [phone] and the fax number Tyler Tanner stated order needs to be sent to is [phone] in attn Tyler Tanner.  Tyler Tanner would like for one of our PCCs to call him in regards to this for pt so all can make sure everything is taken care of in regards to the order and also in regards to pt's insurance. Tyler Tanner can be reached at [phone].  PCCs, please advise on this for us and pt. Thanks!

## 2017-11-22 NOTE — Telephone Encounter (Signed)
lmtcb for Tyler [facility]anner at [employer]

## 2017-12-05 ENCOUNTER — Encounter: Payer: Self-pay | Admitting: Internal Medicine

## 2017-12-05 ENCOUNTER — Ambulatory Visit (INDEPENDENT_AMBULATORY_CARE_PROVIDER_SITE_OTHER): Payer: Medicare Other | Admitting: Internal Medicine

## 2017-12-05 VITALS — BP 130/80 | HR 76 | Ht 66.0 in | Wt 192.6 lb

## 2017-12-05 DIAGNOSIS — J42 Unspecified chronic bronchitis: Secondary | ICD-10-CM

## 2017-12-05 DIAGNOSIS — R490 Dysphonia: Secondary | ICD-10-CM | POA: Diagnosis not present

## 2017-12-05 NOTE — Progress Notes (Signed)
I offered patient a flu shot but he refused and stated he did not want the flu shot TA/CMA  I advised him that the ENT office would call him for an appt. Pt refused appt and did not want me to place the referral. Referral was not placed per MR instructions TA/CMA

## 2017-12-05 NOTE — Progress Notes (Signed)
PI PCP Clovis RileyMitchell, L.August Saucerean, MD   HPI  Ludger NuttingIOV 02/13/2017  Chief Complaint  Patient presents with  . Advice Only    Self referral for wheezing and SOB which has been happening x1 month now. Pt states he also has complaints of hoarseness, cough with clear to yellowish green mucus and chest tightness.    72 year old male who says he is extremely active.  He quit smoking over 40 years ago and only has a 12 pack smoking history.  He says that approximately 1 month ago he started having sinus congestion and sore throat and bronchitis symptoms.  Since then he has had significant shortness of breath and hoarse voice.  He is also lost 3-4 pounds of weight unintentionally.  He says that he normally would walk 15-30 minutes daily.  When he does this same walking he gets very short of breath and he will have to follow through.  He still has cough.  Exam nitric oxide today is only 38 ppb and is borderline and indeterminate for eosinophilic asthma.  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  This is followed up with a CT scan of the chest that shows the same.  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  He does have some mild coronary artery calcification but has never had bypass surgery.  He did get antibiotics a month ago for a week but these have not helped.  Did get one steroid shot but this is not helped. No hx of regular chiropractic manipulations  feNO 02/13/2017 : 38 ppb and bordreline  Results for Fuller MandrilOSBORNE, Jamont E (MRN [id number]) as of 02/13/2017 11:43  Ref. Range 01/27/2017 13:57  Eosinophils Absolute Latest Ref Range: 0.0 - 0.7 K/uL 0.1    Walking desaturation test on 02/13/2017 185 feet x 3 laps on ROOM AIR:  did not desaturate. Rest pulse ox was 97%, final pulse ox was 99%. HR response 82/min at rest to 94/min at peak exertion. Patient Fuller MandrilMelvin E Ghrist  Did not  Desaturate < 88% . Fuller MandrilMelvin E Stella did not  Desaturated </= 3% points. Fuller MandrilMelvin E Ferrell did yes get tachyardic. Did this in moderate pace and did get dyspneic    has a past medical history of GERD (gastroesophageal reflux disease).   reports that he quit smoking about 41 years ago. His smoking use included cigarettes. He has a 12.00 pack-year smoking history. he has never used smokeless tobacco.  OV 04/03/2017  Chief Complaint  Patient presents with  . Follow-up    vomiting and diarrhea last night, unsure what caused it, breo makes him cough and feel weak.     Arlys JohnMelvin Kroboth presents for follow-up.  He underwent sniffed test and it is confirmed that he has a paralyzed left hemidiaphragm.  For the sinus issues he saw Dr. Jearld FentonByers.  I reviewed the note he was given Augmentin and this helped her sinus issues.  However he tells me that he continues to have significant cough and congestion and white sputum.  He feels the Brio that I gave him did not help him at all.  In fact it made him cough more and made him feel weak.  Therefore he stopped it after a week.  He is frustrated by his symptoms.  He does not think any of his symptoms including chest tightness are related to the paralyzed  left diaphragm.  He continues to insist that he is quit smoking although I could smell tobacco on his clothes.  He says he would prefer nebulizers because these have helped him in the past.  He says albuterol inhaler helps a lot   OV 07/25/2017  Chief Complaint  Patient presents with  . Follow-up    Pt states he is doing better since last visit. States he is still coughing at night and states he still has some chest tightness that is worse at night.     [name] , 72 y.o. , with dob 07/03/1945 and male ,Not Hispanic or Latino from [address] [place] [address] - presents to lung  clinic for chronic cough / cronch bronchitis. This is a routine follow-up. Last visit April 2019. He has associated paralyzed  hemidiaphragm unilaterally. He says overall his cough is better but his RSI cough score is 20.He is not taking this DuoNeb 4 times daily but only takes it as needed and of unclear frequency. He says he is continuing his Flonase. There are no other new issues or medical problems. But he does feel his cough is improved. Last CT scan of the chest in 2019 with just paralyzed hemidiaphragm on the right side but otherwise clear lungs. feno 37ppb and high intermediate range- similar to before.Marland Kitchen He does not like breo - made him dizzy but willing to try arnuity.        OV 12/05/2017  Subjective:  Patient ID: [name], male , DOB: 05-21-1945 , age 72 y.o. , MRN: [id number] , ADDRESS: [address] [place] [address]   12/05/2017 -   Chief Complaint  Patient presents with  . Follow-up    f/u chronic bronchitis, some cough at night and early mornings     HPI LUCHIANO VISCOMI 72 y.o. -presents for chronic cough deemed due to chronic bronchitis.  Possible asthma phenotype based on borderline nitric oxide.  In July 2019 I started him on Arnuity.  He comes for follow-up.  In between he is seen nurse practitioner 2 times for exacerbations.  His cough score is currently improved to 12 and is significantly better with Arnuity.  Therefore he feels this is beneficial.  Nevertheless he still has residual cough which he is bothered by.  He says in the daytime he is outdoors and is in the yard and has no problems but when he has exposure to cold air or especially when he comes home he starts coughing.  He says he coughs between 7 PM and 9 PM in the house.  He says there is only one pet and is a Development worker, international aid.  There is no cat.  He has several carpeted rooms but he says he cleans them thoroughly and regularly with professional carpet cleaner.  The carpets are 70 years old.  In addition he says he is got his duct system completely cleaned.  Even had professional cleaners come in clean his house.  He used Clorox and  removed any small level of mildew that might be existing in the house.  He also had a air Geographical information systems officer who told him the air quality in his house is very poor.  He says nobody smokes in the house.  His own last cigarette was 42 years ago.  He is considering getting a guardian air air filter system.  In addition is ongoing issues with hoarseness of voice that is associated with cough.  However nobody has evaluated him for  his hoarseness of voice.  He is never seen ENT.   Dr Gretta Cool Reflux Symptom Index (> 13-15 suggestive of LPR cough)  07/25/2017  12/05/2017   Hoarseness of problem with voice 3 2  Clearing  Of Throat 5 3  Excess throat mucus or feeling of post nasal drip 3 1  Difficulty swallowing food, liquid or tablets 0 0  Cough after eating or lying down 3 1  Breathing difficulties or choking episodes 3 2  Troublesome or annoying cough 3 3  Sensation of something sticking in throat or lump in throat 0 0  Heartburn, chest pain, indigestion, or stomach acid coming up 0 2  TOTAL 20 12   Results for CONSTANTIN, HILLERY (MRN [id number]) as of 12/05/2017 15:30  Ref. Range 07/25/2017 10:20 10/23/2017 11:11  Nitric Oxide Unknown 37 17    ROS - per HPI     has a past medical history of GERD (gastroesophageal reflux disease).   reports that he quit smoking about 42 years ago. His smoking use included cigarettes. He has a 12.00 pack-year smoking history. He has never used smokeless tobacco.  Past Surgical History:  Procedure Laterality Date  . TONSILLECTOMY      No Known Allergies  Immunization History  Administered Date(s) Administered  . Pneumococcal Conjugate-13 05/24/2015  . Pneumococcal Polysaccharide-23 07/25/2017  . Td 04/01/2007    Family History  Problem Relation Age of Onset  . Diabetes Mother   . Coronary artery disease Brother      Current Outpatient Medications:  .  benzonatate (TESSALON) 100 MG capsule, Take 1 capsule (100 mg total) by mouth 3 (three)  times daily., Disp: 30 capsule, Rfl: 1 .  fluticasone (FLONASE) 50 MCG/ACT nasal spray, Place 2 sprays into both nostrils daily., Disp: 16 g, Rfl: 6 .  Fluticasone Furoate (ARNUITY ELLIPTA) 100 MCG/ACT AEPB, Inhale 1 puff into the lungs daily., Disp: 30 each, Rfl: 5 .  fluticasone furoate-vilanterol (BREO ELLIPTA) 100-25 MCG/INH AEPB, Inhale into the lungs., Disp: , Rfl:  .  ibuprofen (ADVIL,MOTRIN) 800 MG tablet, Take 800 mg by mouth every 8 (eight) hours as needed for moderate pain. , Disp: , Rfl:  .  ipratropium-albuterol (DUONEB) 0.5-2.5 (3) MG/3ML SOLN, Take 3 mLs by nebulization 4 (four) times daily as needed., Disp: 360 mL, Rfl: 12 .  NEXIUM 20 MG capsule, Take 1 tablet by mouth as needed., Disp: , Rfl: 5 .  predniSONE (DELTASONE) 10 MG tablet, Take 4 tabs for 2 days, then 3 tabs for 2 days, then 2 tabs for 2 days, then 1 tab for 2 days, then stop, Disp: 20 tablet, Rfl: 0 .  PROAIR HFA 108 (90 Base) MCG/ACT inhaler, Inhale 2 puffs into the lungs every 6 (six) hours as needed., Disp: , Rfl: 0 .  tamsulosin (FLOMAX) 0.4 MG CAPS capsule, Take 1 capsule (0.4 mg total) by mouth daily after supper., Disp: 14 capsule, Rfl: 0      Objective:   Vitals:   12/05/17 1502  BP: 130/80  Pulse: 76  SpO2: 96%  Weight: 192 lb 9.6 oz (87.4 kg)  Height: 5\' 6"  (1.676 m)    Estimated body mass index is 31.09 kg/m as calculated from the following:   Height as of this encounter: 5\' 6"  (1.676 m).   Weight as of this encounter: 192 lb 9.6 oz (87.4 kg).  @WEIGHTCHANGE @  Filed Weights   12/05/17 1502  Weight: 192 lb 9.6 oz (87.4 kg)     Physical Exam  General Appearance:    Alert, cooperative, no distress, appears stated age - no , Deconditioned looking - yesn , OBESE  - no, Sitting on Wheelchair -  no  Head:    Normocephalic, without obvious abnormality, atraumatic  Eyes:    PERRL, conjunctiva/corneas clear,  Ears:    Normal TM's and external ear canals, both ears  Nose:   Nares normal, septum  midline, mucosa normal, no drainage    or sinus tenderness. OXYGEN ON  - no . Patient is @ no   Throat:   Lips, mucosa, and tongue normal; teeth and gums normal. Cyanosis on lips - no  Neck:   Supple, symmetrical, trachea midline, no adenopathy;    thyroid:  no enlargement/tenderness/nodules; no carotid   bruit or JVD  Back:     Symmetric, no curvature, ROM normal, no CVA tenderness  Lungs:     Distress - no , Wheeze no, Barrell Chest - no, Purse lip breathing - no, Crackles - no. VOICE IS HOARSE   Chest Wall:    No tenderness or deformity.    Heart:    Regular rate and rhythm, S1 and S2 normal, no rub   or gallop, Murmur - no  Breast Exam:    NOT DONE  Abdomen:     Soft, non-tender, bowel sounds active all four quadrants,    no masses, no organomegaly. Visceral obesity - no  Genitalia:   NOT DONE  Rectal:   NOT DONE  Extremities:   Extremities - normal, Has Cane - no, Clubbing - no, Edema - no  Pulses:   2+ and symmetric all extremities  Skin:   Stigmata of Connective Tissue Disease - no  Lymph nodes:   Cervical, supraclavicular, and axillary nodes normal  Psychiatric:  Neurologic:   Pleasant - yes, Anxious - no, Flat affect - no  CAm-ICU - neg, Alert and Oriented x 3 - yes, Moves all 4s - yes, Speech - normal, Cognition - intact           Assessment:       ICD-10-CM   1. Chronic bronchitis, unspecified chronic bronchitis type (HCC) J42   2. Hoarseness of voice R49.0        Plan:     Patient Instructions  Chronic bronchitis, unspecified chronic bronchitis type (HCC) -Glad you are better after starting Arnuity as evidenced by improvement in nitric oxide scores and cough score -Too bad the cough gets worse when you enter into the house despite your best efforts at cleaning it -I have given you further advice on the usage of air filters and further efforts at removing upholstery and carpets and gave you literature on this -I definitely think you will benefit from a room air  filter system such as the "[employer]" brochure you showed me - recommend high dose flu shot 12/05/2017 - respect deferral/refusal    Hoarseness of voice  - still ongoing, - cause not know - refer ENT  Followup 6 months or sooner if needed   > 50% of this > 25 min visit spent in face to face counseling or coordination of care - by this undersigned MD - Dr Kalman Shan. This includes one or more of the following documented above: discussion of test results, diagnostic or treatment recommendations, prognosis, risks and benefits of management options, instructions, education, compliance or risk-factor reduction   SIGNATURE    Dr. Kalman Shan, M.D., F.C.C.P,  Pulmonary and Critical Care Medicine Staff Physician, [facility] Director - Interstitial Lung Disease  Program  Pulmonary Fibrosis [facility] at [facility] [place], [place], [address]  Pager: [phone], If no answer or between  15:00h - 7:00h: call [phone] Telephone: [phone]  3:53 PM 12/05/2017

## 2017-12-05 NOTE — Patient Instructions (Addendum)
Chronic bronchitis, unspecified chronic bronchitis type (HCC) -Glad you are better after starting Arnuity as evidenced by improvement in nitric oxide scores and cough score -Too bad the cough gets worse when you enter into the house despite your best efforts at cleaning it -I have given you further advice on the usage of air filters and further efforts at removing upholstery and carpets and gave you literature on this -I definitely think you will benefit from a room air filter system such as the "[employer]" brochure you showed me - recommend high dose flu shot 12/05/2017 - respect deferral/refusal    Hoarseness of voice  - still ongoing, - cause not know - refer ENT  Followup 6 months or sooner if needed

## 2017-12-26 ENCOUNTER — Telehealth: Payer: Self-pay | Admitting: Internal Medicine

## 2017-12-26 MED ORDER — PROAIR HFA 108 (90 BASE) MCG/ACT IN AERS: 2.0000 | INHALATION_SPRAY | Freq: Four times a day (QID) | RESPIRATORY_TRACT | 5 refills | Status: DC | PRN

## 2017-12-26 NOTE — Telephone Encounter (Signed)
Call made to patient, confirmed pharmacy, made aware his refill has been sent in. Voiced understanding. Nothing further is needed at this time.

## 2018-01-23 ENCOUNTER — Ambulatory Visit: Payer: Self-pay | Admitting: Internal Medicine

## 2018-02-02 ENCOUNTER — Other Ambulatory Visit: Payer: Self-pay | Admitting: Internal Medicine

## 2018-05-03 ENCOUNTER — Other Ambulatory Visit: Payer: Self-pay

## 2018-05-03 ENCOUNTER — Encounter (HOSPITAL_COMMUNITY): Payer: Self-pay

## 2018-05-03 DIAGNOSIS — Z79899 Other long term (current) drug therapy: Secondary | ICD-10-CM | POA: Insufficient documentation

## 2018-05-03 DIAGNOSIS — Z87891 Personal history of nicotine dependence: Secondary | ICD-10-CM | POA: Diagnosis not present

## 2018-05-03 DIAGNOSIS — R1013 Epigastric pain: Secondary | ICD-10-CM | POA: Insufficient documentation

## 2018-05-03 LAB — CBC
HCT: 50.5 % (ref 39.0–52.0)
Hemoglobin: 16.6 g/dL (ref 13.0–17.0)
MCH: 30.3 pg (ref 26.0–34.0)
MCHC: 32.9 g/dL (ref 30.0–36.0)
MCV: 92.3 fL (ref 80.0–100.0)
Platelets: 182 10*3/uL (ref 150–400)
RBC: 5.47 MIL/uL (ref 4.22–5.81)
RDW: 12.7 % (ref 11.5–15.5)
WBC: 10.1 10*3/uL (ref 4.0–10.5)
nRBC: 0 % (ref 0.0–0.2)

## 2018-05-03 MED ORDER — SODIUM CHLORIDE 0.9% FLUSH: 3.0000 mL | Freq: Once | INTRAVENOUS | Status: DC

## 2018-05-03 NOTE — ED Triage Notes (Signed)
States about 1800 pm ate barbecue and slaw and got pain in epigastric area took tums, pepto, and mylanta without relief states he has been belching a lot.

## 2018-05-04 ENCOUNTER — Emergency Department (HOSPITAL_COMMUNITY): Payer: Medicare Other

## 2018-05-04 ENCOUNTER — Emergency Department (HOSPITAL_COMMUNITY)
Admission: EM | Admit: 2018-05-04 | Discharge: 2018-05-04 | Disposition: A | Discharge status: Home / Self Care | Payer: Medicare Other | Attending: Emergency Medicine | Admitting: Emergency Medicine

## 2018-05-04 DIAGNOSIS — R1013 Epigastric pain: Secondary | ICD-10-CM | POA: Diagnosis not present

## 2018-05-04 LAB — URINALYSIS, ROUTINE W REFLEX MICROSCOPIC
Bacteria, UA: NONE SEEN
Bilirubin Urine: NEGATIVE
Glucose, UA: NEGATIVE mg/dL
Ketones, ur: 20 mg/dL — AB
Leukocytes,Ua: NEGATIVE
Nitrite: NEGATIVE
Protein, ur: NEGATIVE mg/dL
Specific Gravity, Urine: 1.024 (ref 1.005–1.030)
pH: 5 (ref 5.0–8.0)

## 2018-05-04 LAB — COMPREHENSIVE METABOLIC PANEL
ALT: 20 U/L (ref 0–44)
AST: 20 U/L (ref 15–41)
Albumin: 4.4 g/dL (ref 3.5–5.0)
Alkaline Phosphatase: 69 U/L (ref 38–126)
Anion gap: 8 (ref 5–15)
BUN: 18 mg/dL (ref 8–23)
CO2: 25 mmol/L (ref 22–32)
Calcium: 9 mg/dL (ref 8.9–10.3)
Chloride: 108 mmol/L (ref 98–111)
Creatinine, Ser: 1.22 mg/dL (ref 0.61–1.24)
GFR calc Af Amer: >60 mL/min (ref >60)
GFR calc non Af Amer: 59 mL/min — ABNORMAL LOW (ref >60)
Glucose, Bld: 141 mg/dL — ABNORMAL HIGH (ref 70–99)
Potassium: 3.9 mmol/L (ref 3.5–5.1)
Sodium: 141 mmol/L (ref 135–145)
Total Bilirubin: 0.9 mg/dL (ref 0.3–1.2)
Total Protein: 7.4 g/dL (ref 6.5–8.1)

## 2018-05-04 LAB — LIPASE, BLOOD: Lipase: 26 U/L (ref 11–51)

## 2018-05-04 MED ORDER — SUCRALFATE 1 G PO TABS: 1.0000 g | ORAL_TABLET | Freq: Three times a day (TID) | ORAL | 0 refills | Status: AC

## 2018-05-04 MED ORDER — OMEPRAZOLE 20 MG PO CPDR: DELAYED_RELEASE_CAPSULE | ORAL | 0 refills | Status: DC

## 2018-05-04 MED ORDER — PANTOPRAZOLE SODIUM 40 MG PO TBEC
80.0000 mg | DELAYED_RELEASE_TABLET | Freq: Once | ORAL | Status: AC
  Administered 2018-05-04: 80 mg via ORAL
  Filled 2018-05-04: qty 2

## 2018-05-04 MED ORDER — SUCRALFATE 1 GM/10ML PO SUSP
1.0000 g | Freq: Once | ORAL | Status: AC
  Administered 2018-05-04: 03:00:00 1 g via ORAL
  Filled 2018-05-04: qty 10

## 2018-05-04 NOTE — ED Notes (Signed)
Patient ambulated to RR. Urine sample at bedside. Patient verbalizing abdominal discomfort.

## 2018-05-04 NOTE — ED Provider Notes (Signed)
[facility]-EMERGENCY DEPT Provider Note: Tyler Dell, MD, FACEP  CSN: [id number] MRN: [id number] ARRIVAL: 05/03/18 at 2127 ROOM: WA21/WA21   CHIEF COMPLAINT  Abdominal Pain   HISTORY OF PRESENT ILLNESS  05/04/18 1:27 AM Tyler Tanner is a 73 y.o. male who developed epigastric pain about 6 PM after eating barbecue and slowly.  He states the pain is a 10 out of 10 at its worst although the pain has been waxing and waning.  It has both dull and intermittent sharp components.  It is worse with movement or palpation.  He has been belching excessively and did have one small episode of vomiting.  He denies persistent nausea or diarrhea.  He has taken Pepto-Bismol, Tums and Mylanta without relief.  He feels like his abdomen is distended.   Past Medical History:  Diagnosis Date  . GERD (gastroesophageal reflux disease)     Past Surgical History:  Procedure Laterality Date  . TONSILLECTOMY      Family History  Problem Relation Age of Onset  . Diabetes Mother   . Coronary artery disease Brother     Social History   Tobacco Use  . Smoking status: Former Smoker    Packs/day: 1.00    Years: 12.00    Pack years: 12.00    Types: Cigarettes    Last attempt to quit: 07/25/1975    Years since quitting: 42.8  . Smokeless tobacco: Never Used  Substance Use Topics  . Alcohol use: No  . Drug use: No    Prior to Admission medications   Medication Sig Start Date End Date Taking? Authorizing Provider  fluticasone (FLONASE) 50 MCG/ACT nasal spray Place 2 sprays into both nostrils daily. Patient taking differently: Place 2 sprays into both nostrils daily as needed for allergies.  02/07/17  Yes Mliss Sax, MD  ibuprofen (ADVIL,MOTRIN) 800 MG tablet Take 800 mg by mouth every 8 (eight) hours as needed for moderate pain.  03/22/16  Yes [provider]  tamsulosin (FLOMAX) 0.4 MG CAPS capsule Take 1 capsule (0.4 mg total) by mouth daily after supper. 03/27/16  Yes Shaune Pollack, MD  ARNUITY ELLIPTA 100 MCG/ACT AEPB TAKE 1 PUFF BY MOUTH EVERY DAY Patient not taking: Reported on 05/04/2018 02/03/18   Kalman Shan, MD  benzonatate (TESSALON) 100 MG capsule Take 1 capsule (100 mg total) by mouth 3 (three) times daily. Patient not taking: Reported on 05/04/2018 11/20/17   Ivonne Andrew, NP  ipratropium-albuterol (DUONEB) 0.5-2.5 (3) MG/3ML SOLN Take 3 mLs by nebulization 4 (four) times daily as needed. Patient not taking: Reported on 05/04/2018 04/04/17   Kalman Shan, MD  predniSONE (DELTASONE) 10 MG tablet Take 4 tabs for 2 days, then 3 tabs for 2 days, then 2 tabs for 2 days, then 1 tab for 2 days, then stop Patient not taking: Reported on 05/04/2018 11/20/17   Ivonne Andrew, NP  PROAIR HFA [phone] Base) MCG/ACT inhaler Inhale 2 puffs into the lungs every 6 (six) hours as needed. Patient taking differently: Inhale 2 puffs into the lungs every 6 (six) hours as needed for wheezing.  12/26/17   Kalman Shan, MD    Allergies Patient has no known allergies.   REVIEW OF SYSTEMS  Negative except as noted here or in the History of Present Illness.   PHYSICAL EXAMINATION  Initial Vital Signs Blood pressure (!) 143/92, pulse 75, temperature 97.9 F (36.6 C), temperature source Oral, resp. rate 18, height  (1.676 m), weight 86.2  kg, SpO2 97 %.  Examination General: Well-developed, well-nourished male in no acute distress; appearance consistent with age of record HENT: normocephalic; atraumatic Eyes: pupils equal, round and reactive to light; extraocular muscles intact Neck: supple Heart: regular rate and rhythm Lungs: clear to auscultation bilaterally Abdomen: soft; no appreciable distention; epigastric tenderness; no masses or hepatosplenomegaly; bowel sounds present Extremities: No deformity; full range of motion; pulses normal Neurologic: Awake, alert and oriented; motor function intact in all extremities and symmetric; no facial droop Skin:  Warm and dry Psychiatric: Normal mood and affect   RESULTS  Summary of this visit's results, reviewed by myself:   EKG Interpretation  Date/Time:    Ventricular Rate:    PR Interval:    QRS Duration:   QT Interval:    QTC Calculation:   R Axis:     Text Interpretation:        Laboratory Studies: Results for orders placed or performed during the hospital encounter of 05/04/18 (from the past 24 hour(s))  Lipase, blood     Status: None   Collection Time: 05/03/18 11:19 PM  Result Value Ref Range   Lipase 26 11 - 51 U/L  Comprehensive metabolic panel     Status: Abnormal   Collection Time: 05/03/18 11:19 PM  Result Value Ref Range   Sodium 141 135 - 145 mmol/L   Potassium 3.9 3.5 - 5.1 mmol/L   Chloride 108 98 - 111 mmol/L   CO2 25 22 - 32 mmol/L   Glucose, Bld 141 (H) 70 - 99 mg/dL   BUN 18 8 - 23 mg/dL   Creatinine, Ser 9.141.22 0.61 - 1.24 mg/dL   Calcium 9.0 8.9 - 78.210.3 mg/dL   Total Protein 7.4 6.5 - 8.1 g/dL   Albumin 4.4 3.5 - 5.0 g/dL   AST 20 15 - 41 U/L   ALT 20 0 - 44 U/L   Alkaline Phosphatase 69 38 - 126 U/L   Total Bilirubin 0.9 0.3 - 1.2 mg/dL   GFR calc non Af Amer 59 (L) >60 mL/min   GFR calc Af Amer >60 >60 mL/min   Anion gap 8 5 - 15  CBC     Status: None   Collection Time: 05/03/18 11:19 PM  Result Value Ref Range   WBC 10.1 4.0 - 10.5 K/uL   RBC 5.47 4.22 - 5.81 MIL/uL   Hemoglobin 16.6 13.0 - 17.0 g/dL   HCT 95.650.5 21.339.0 - 08.652.0 %   MCV 92.3 80.0 - 100.0 fL   MCH 30.3 26.0 - 34.0 pg   MCHC 32.9 30.0 - 36.0 g/dL   RDW 57.812.7 46.911.5 - 62.915.5 %   Platelets 182 150 - 400 K/uL   nRBC 0.0 0.0 - 0.2 %   Imaging Studies: Dg Abd Acute 2+v W 1v Chest  Result Date: 05/04/2018 CLINICAL DATA:  Epigastric pain EXAM: DG ABDOMEN ACUTE W/ 1V CHEST COMPARISON:  01/27/2017 FINDINGS: Stable chronic elevation of the left hemidiaphragm. Heart is normal size. Lungs clear. No effusions or acute bony abnormality. There is nonobstructive bowel gas pattern. Gas throughout mildly  prominent large and small bowel loops. No free air. No organomegaly or suspicious calcification. No acute bony abnormality. IMPRESSION: No evidence of bowel obstruction or free air. Stable chronic elevation of the left hemidiaphragm. No acute cardiopulmonary disease. Electronically Signed   By: Charlett NoseKevin  Dover M.D.   On: 05/04/2018 02:07    ED COURSE and MDM  Nursing notes and initial vitals signs, including pulse oximetry, reviewed.  Vitals:   05/03/18 2143 05/04/18 0115 05/04/18 0200 05/04/18 0300  BP: (!) 157/85 (!) 143/92 (!) 149/85 127/71  Pulse: 74 75 75 76  Resp: 18 18 16 15   Temp: 97.9 F (36.6 C)     TempSrc: Oral     SpO2: 97% 97% 96% 94%  Weight: 86.2 kg     Height: 5\' 6"  (1.676 m)      3:35 AM Patient feeling better after oral Protonix and Carafate.  His laboratory studies and radiographs are unremarkable.  I suspect this represents gastritis or early peptic ulcer disease.  We will place him on appropriate medications.  He was advised to return for worsening or changing symptoms.  PROCEDURES    ED DIAGNOSES     ICD-10-CM   1. Epigastric pain R10.13        Marvette Schamp, Jonny Ruiz, MD 05/04/18 [phone]

## 2019-07-04 IMAGING — CT CT CHEST W/O CM
2 of 3 series · 15 of 36 positions shown, 18 images · non-contrast
Comparison: None; correlation chest radiograph 01/27/2017

CLINICAL DATA: Cough and shortness of breath for 1 month, elevated
LEFT hemidiaphragm, history of GERD

EXAM:
CT CHEST WITHOUT CONTRAST
TECHNIQUE: Multidetector CT imaging of the chest was performed following the
standard protocol without IV contrast. Sagittal and coronal MPR
images reconstructed from axial data set.

[Series 2: thorax · axial · 0.72mm/px · z∈[-293,-21]mm · 12 of 160 slices shown, 15 images]
[im 12/160  mediastinal]
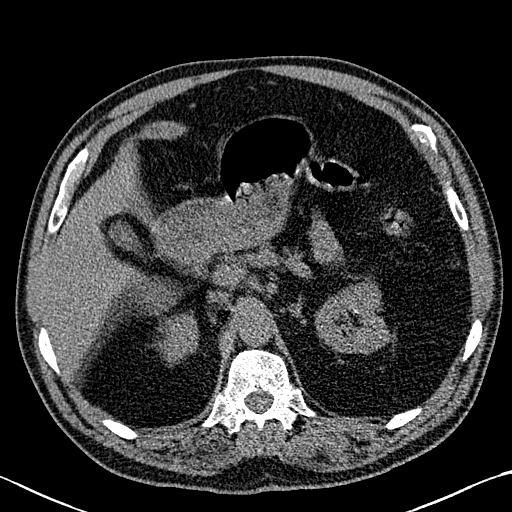
[im 12/160  lung]
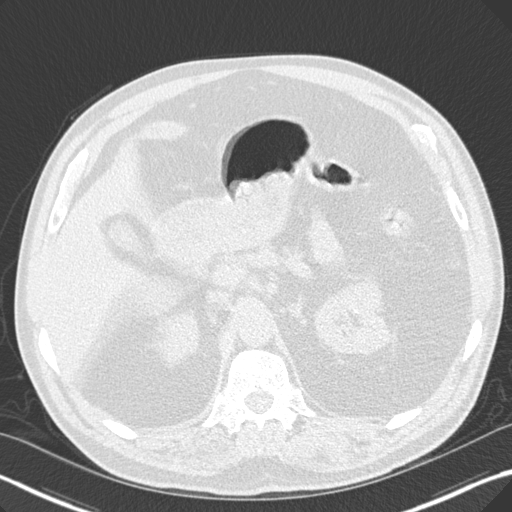
[im 24/160  lung]
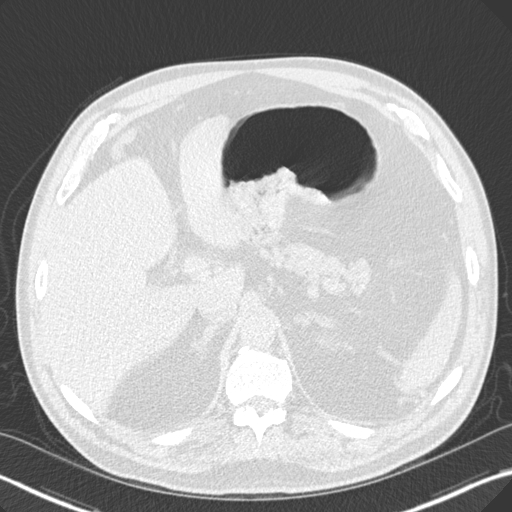
[im 36/160  lung]
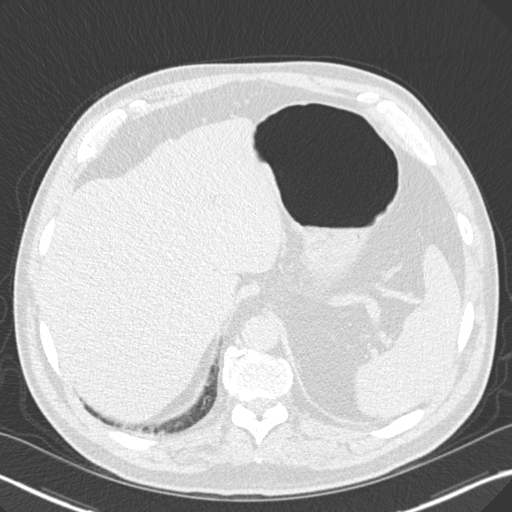
[im 48/160  lung]
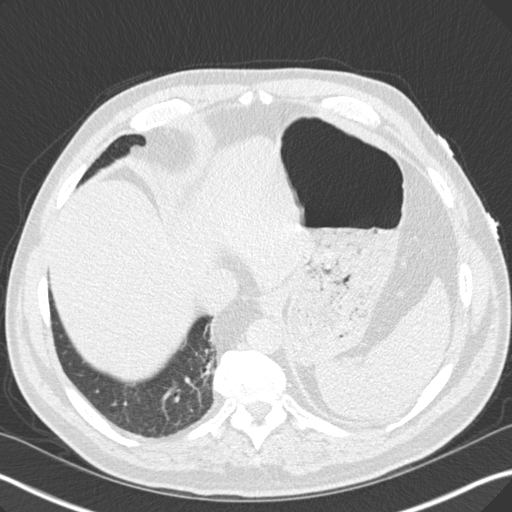
[im 59/160  mediastinal]
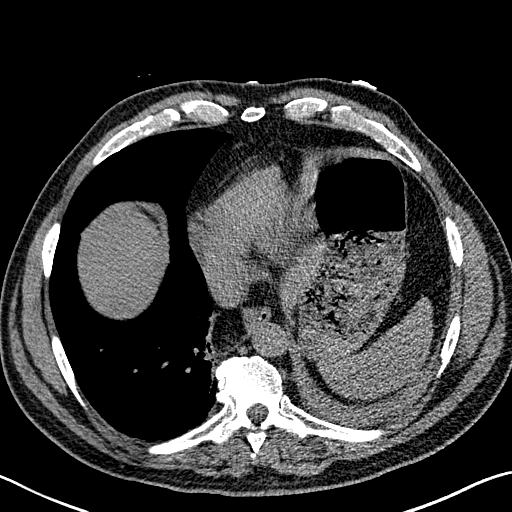
[im 59/160  lung]
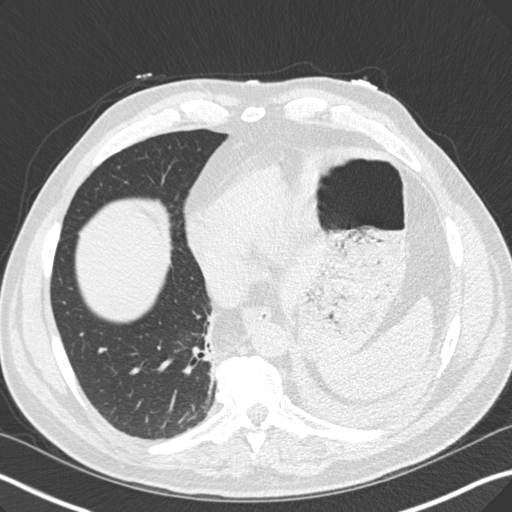
[im 71/160  lung]
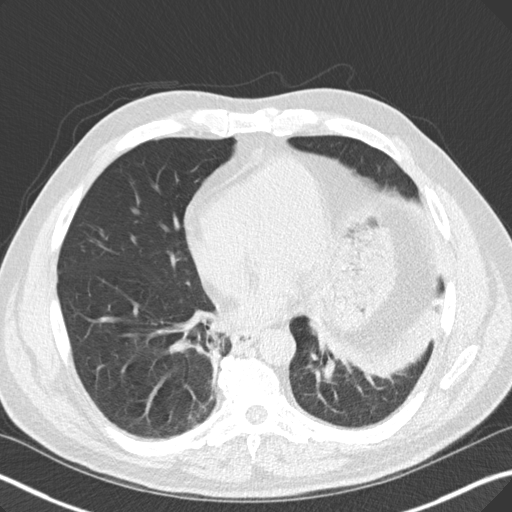
[im 89/160  lung]
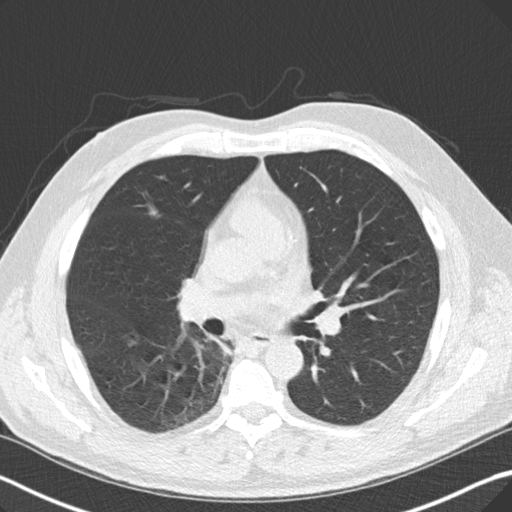
[im 101/160  lung]
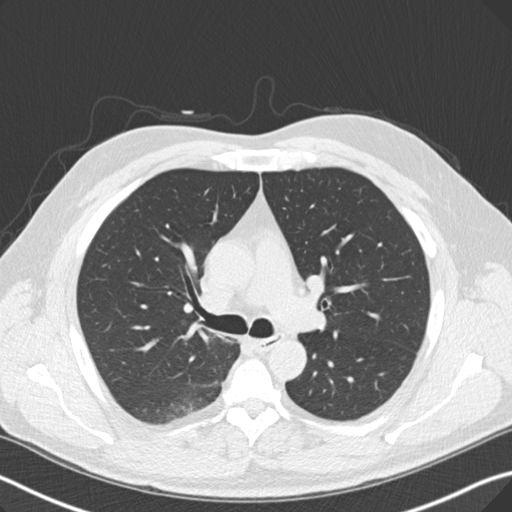
[im 112/160  mediastinal]
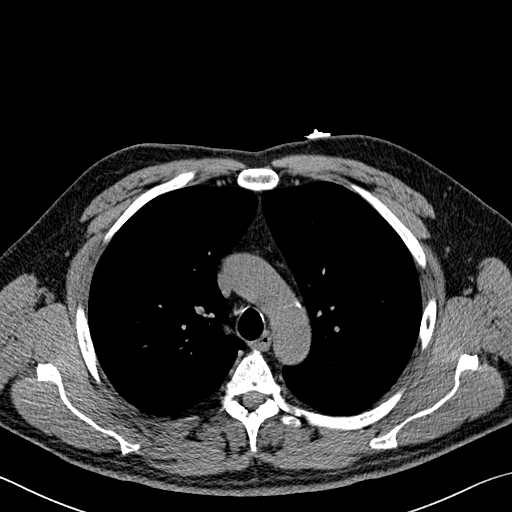
[im 112/160  lung]
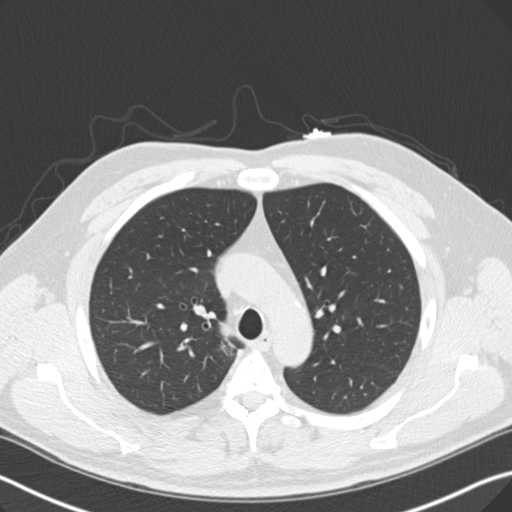
[im 124/160  lung]
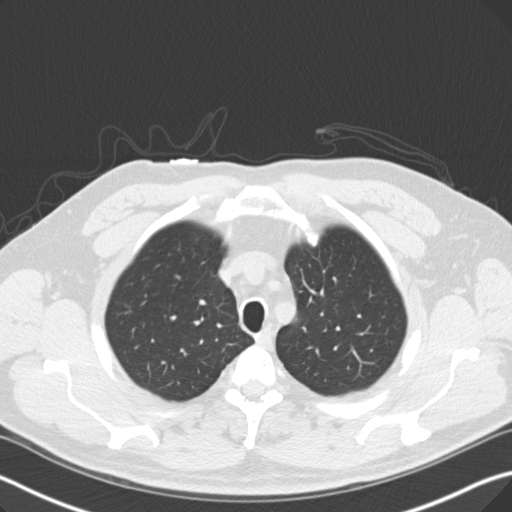
[im 136/160  lung]
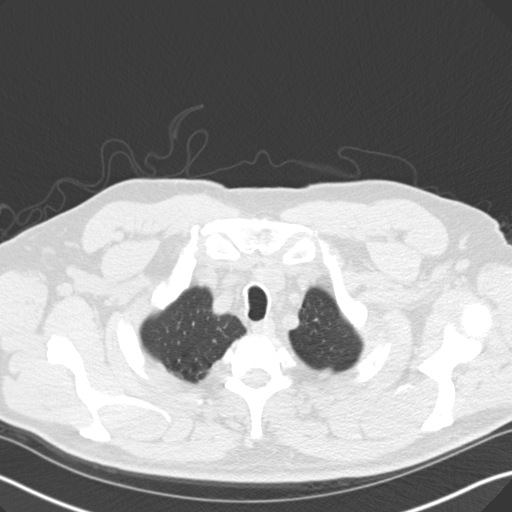
[im 148/160  lung]
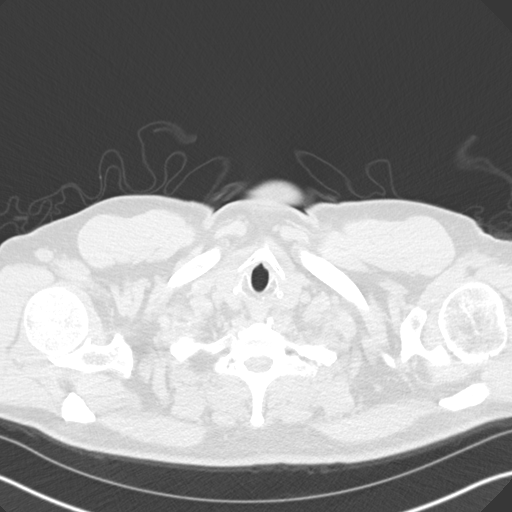

[Series 6: coronal · coronal · 0.62mm/px · 3 of 151 slices shown]
[im 31/151  lung]
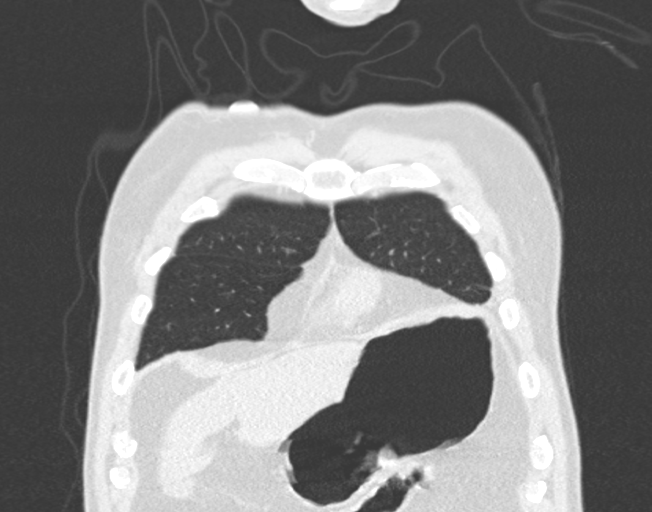
[im 61/151  lung]
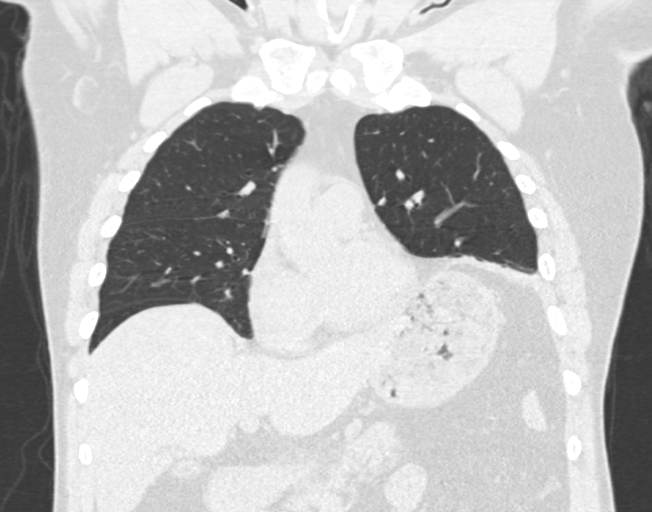
[im 91/151  lung]
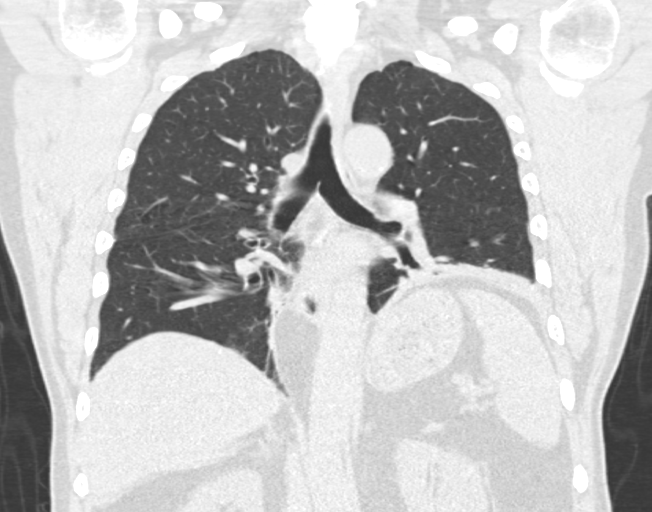

[15 of 36 positions shown; findings below may reference images not displayed]

FINDINGS: Cardiovascular: Atherosclerotic calcifications aorta without
aneurysm. Minimal coronary arterial calcification. No pericardial
effusion.

Mediastinum/Nodes: Base of cervical region unremarkable. No thoracic
adenopathy. Esophagus unremarkable. Mildly prominent fat at inferior
mediastinum question herniation through esophageal hiatus.

Lungs/Pleura: Scarring at medial aspect of RIGHT lower lobe.
Subsegmental atelectasis at base of LEFT lower lobe. Remaining lungs
clear. No pulmonary infiltrate, pleural effusion, or pneumothorax.

Upper Abdomen: Cholelithiasis. Remaining visualized upper abdomen
unremarkable.

Musculoskeletal: Degenerative disc disease changes at visualized
inferior cervical spine. Lytic lesion within manubrium LEFT of
midline, well-defined, generally benign in appearance question
hemangioma. No other bone lesions.
IMPRESSION: Medial RIGHT lower lobe scarring with subsegmental atelectasis at
LEFT base.

Lungs otherwise unremarkable.

Cholelithiasis.

Aortic Atherosclerosis ([id number]-FE6.6).

## 2019-07-14 ENCOUNTER — Encounter: Payer: Self-pay | Admitting: Primary Care

## 2019-07-14 ENCOUNTER — Other Ambulatory Visit: Payer: Self-pay

## 2019-07-14 ENCOUNTER — Ambulatory Visit (INDEPENDENT_AMBULATORY_CARE_PROVIDER_SITE_OTHER): Payer: Medicare Other | Admitting: Primary Care

## 2019-07-14 VITALS — BP 128/78 | HR 72 | Temp 97.5°F | Ht 66.0 in | Wt 195.8 lb

## 2019-07-14 DIAGNOSIS — J4521 Mild intermittent asthma with (acute) exacerbation: Secondary | ICD-10-CM | POA: Diagnosis not present

## 2019-07-14 MED ORDER — SALINE SPRAY 0.65 % NA SOLN: 1.0000 | NASAL | 1 refills | Status: AC | PRN

## 2019-07-14 MED ORDER — PREDNISONE 10 MG PO TABS: ORAL_TABLET | ORAL | 0 refills | Status: DC

## 2019-07-14 MED ORDER — CLINDAMYCIN HCL 300 MG PO CAPS: 300.0000 mg | ORAL_CAPSULE | Freq: Three times a day (TID) | ORAL | 0 refills | Status: DC

## 2019-07-14 NOTE — Assessment & Plan Note (Addendum)
Symptoms previously well controlled on as needed [place]. He stopped ICS inhaler approximately 1 year ago. Presents today with increased cough, sinus congestion/pressure and chest tightness x 2-3 weeks. We will treat him today for an acute flare of asthmatic bronchitis. Sending in prescription for clindamycin 1 tab 3 times daily x10 days and prednisone taper as directed. Recommend he continue using Flonase once daily and add Ocean nasal spray twice daily for sinusitis symptoms. He is to follow-up in 6 months with our office or sooner if not better. Advised patient if he has 2-3 recurrent exacerbations of his symptoms in a year we should resume maintenance steroid inhaler. Hammer  Asthmatic bronchitis:  Use albuterol rescue inhaler every 6-8 hours for the next week  Rx Prednisone taper as directed; Clindamycin 1 tab three times a day x 10 days (sent to pharmacy on file)  Sinusitis: Resume Flonase nasal spray Start ocean nasal spray twice daily  Follow-up 6 months or sooner if not better

## 2019-07-14 NOTE — Progress Notes (Signed)
@Patient  ID: , male    DOB: 1945-09-08, 74 y.o.   MRN: [id number]  Chief Complaint  Patient presents with  . Follow-up    Referring provider: [id number]., MD  HPI: 74 year old male, former smoker quit 1977 (12-pack-year history).  Past medical history significant for chronic bronchitis, COPD, reactive airway disease.  Patient of Dr. 1978, last seen in office on 12/05/2017. FENO on 02/13/17 was borderline elevated at 38ppb, this is indeterminate for eosinophilic asthma.  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  This was followed up with a CT scan of his chest which showed the same. He was unable to tolerate Breo in the past due to dizziness.  This was changed to Arnuity which he felt worked well for him.  07/14/2019 Patient presents today for an acute office visit with reports of shortness of breath and chest congestion.  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 His cough worsened 1 month ago. He is getting up clear mucus. He stopped using Arnutiy Ellipta inhaler d/t feeling better. He has not been on steroid inhaler for the last year. He uses his rescue inhaler once a week on average. He has associated chest tightness, nasal congestion/pressure which started 4-5 days ago. He has seasonal allergies. He takes mucinex for cough and flonase nasal as needed.     PFTs 04/2017 - mixed restriction/obstruction with significant bronchodilator response.  Decreased diffusion capacity.  No Known Allergies  Immunization History  Administered Date(s) Administered  . Pneumococcal Conjugate-13 05/24/2015  . Pneumococcal Polysaccharide-23 07/25/2017  . Td 04/01/2007    Past Medical History:  Diagnosis Date  . GERD (gastroesophageal reflux disease)     Tobacco History: Social History   Tobacco Use  Smoking Status Former Smoker  . Packs/day: 1.00  . Years: 12.00  . Pack years: 12.00  .  Types: Cigarettes  . Quit date: 07/25/1975  . Years since quitting: 44.0  Smokeless Tobacco Never Used   Counseling given: Not Answered   Outpatient Medications Prior to Visit  Medication Sig Dispense Refill  . fluticasone (FLONASE) 50 MCG/ACT nasal spray Place 2 sprays into both nostrils daily. (Patient taking differently: Place 2 sprays into both nostrils daily as needed for allergies. ) 16 g 6  . omeprazole (PRILOSEC) 20 MG capsule Take 1 capsule every morning at least 30 minutes before first dose of Carafate. 30 capsule 0  . PROAIR HFA 108 (90 Base) MCG/ACT inhaler Inhale 2 puffs into the lungs every 6 (six) hours as needed. (Patient taking differently: Inhale 2 puffs into the lungs every 6 (six) hours as needed for wheezing. ) 1 Inhaler 5  . tamsulosin (FLOMAX) 0.4 MG CAPS capsule Take 1 capsule (0.4 mg total) by mouth daily after supper. 14 capsule 0  . amLODipine (NORVASC) 10 MG tablet Take 10 mg by mouth daily.    . sucralfate (CARAFATE) 1 g tablet Take 1 tablet (1 g total) by mouth 4 (four) times daily -  with meals and at bedtime. (Patient not taking: Reported on 07/14/2019) 40 tablet 0   No facility-administered medications prior to visit.   Review of Systems  Review of Systems  Constitutional: Negative.   HENT: Positive for congestion, postnasal drip and sinus pressure.   Respiratory: Positive for cough and chest tightness.     Physical Exam  BP 128/78 (BP Location: Left Arm, Cuff Size: Normal)   Pulse 72   Temp (!) 97.5  F (36.4 C) (Oral)   Ht 5\' 6"  (1.676 m)   Wt 195 lb 12.8 oz (88.8 kg)   SpO2 96%   BMI 31.60 kg/m  Physical Exam Constitutional:      Appearance: Normal appearance.  HENT:     Head: Normocephalic and atraumatic.     Mouth/Throat:     Mouth: Mucous membranes are moist.     Pharynx: Oropharynx is clear.  Cardiovascular:     Rate and Rhythm: Normal rate and regular rhythm.  Pulmonary:     Effort: Pulmonary effort is normal.     Breath sounds:  Normal breath sounds. No wheezing.  Skin:    General: Skin is warm and dry.  Neurological:     General: No focal deficit present.     Mental Status: He is alert and oriented to person, place, and time. Mental status is at baseline.  Psychiatric:        Mood and Affect: Mood normal.        Behavior: Behavior normal.        Thought Content: Thought content normal.        Judgment: Judgment normal.      Lab Results:  CBC    Component Value Date/Time   WBC 10.1 05/03/2018 2319   RBC 5.47 05/03/2018 2319   HGB 16.6 05/03/2018 2319   HCT 50.5 05/03/2018 2319   PLT 182 05/03/2018 2319   MCV 92.3 05/03/2018 2319   MCH 30.3 05/03/2018 2319   MCHC 32.9 05/03/2018 2319   RDW 12.7 05/03/2018 2319   LYMPHSABS 2.7 01/27/2017 1357   MONOABS 0.6 01/27/2017 1357   EOSABS 0.1 01/27/2017 1357   BASOSABS 0.0 01/27/2017 1357    BMET    Component Value Date/Time   NA 141 05/03/2018 2319   K 3.9 05/03/2018 2319   CL 108 05/03/2018 2319   CO2 25 05/03/2018 2319   GLUCOSE 141 (H) 05/03/2018 2319   BUN 18 05/03/2018 2319   CREATININE 1.22 05/03/2018 2319   CALCIUM 9.0 05/03/2018 2319   GFRNONAA 59 (L) 05/03/2018 2319   GFRAA >60 05/03/2018 2319    BNP No results found for: BNP  ProBNP No results found for: PROBNP  Imaging: No results found.   Assessment & Plan:   Asthmatic bronchitis with acute exacerbation Symptoms previously well controlled on as needed 07/03/2018. He stopped ICS inhaler approximately 1 year ago. Presents today with increased cough, sinus congestion/pressure and chest tightness x 2-3 weeks. We will treat him today for an acute flare of asthmatic bronchitis. Sending in prescription for clindamycin 1 tab 3 times daily x10 days and prednisone taper as directed. Recommend he continue using Flonase once daily and add Ocean nasal spray twice daily for sinusitis symptoms. He is to follow-up in 6 months with our office or sooner if not better. Advised patient if he has 2-3  recurrent exacerbations of his symptoms in a year we should resume maintenance steroid inhaler. Hammer  Asthmatic bronchitis:  Use albuterol rescue inhaler every 6-8 hours for the next week  Rx Prednisone taper as directed; Clindamycin 1 tab three times a day x 10 days (sent to pharmacy on file)  Sinusitis: Resume Flonase nasal spray Start ocean nasal spray twice daily  Follow-up 6 months or sooner if not better    [place], NP 07/14/2019

## 2019-07-14 NOTE — Patient Instructions (Addendum)
Sinusitis: Resume Flonase nasal spray Start ocean nasal spray twice daily  Asthmatic bronchitis:  Use albuterol rescue inhaler every 6-8 hours for the next week  Rx Prednisone taper as directed and Clindamycin 1 tab three times a day x 10 days (sent to pharmacy on file)  Follow-up 6 months or sooner if not better * If you have 2-3 execrations a year recommend you follow-up with [place] sooner and may resume steroid inhaler daily

## 2019-07-26 IMAGING — RF DG SNIFF TEST
1 series · 4 of 4 positions shown · non-contrast
Comparison: Chest x-ray and chest CT dated 01/27/2017 and chest
x-ray dated 09/04/2006

CLINICAL DATA: Shortness of breath.  Elevated left hemidiaphragm.

EXAM:
CHEST FLUOROSCOPY
TECHNIQUE: Real-time fluoroscopic evaluation of the chest was performed.
FLUOROSCOPY TIME:  Fluoroscopy Time:  0.3 minutes.
Radiation Exposure Index (if provided by the fluoroscopic device):
1.5 mGy

[Series 1: cp_chest · 0.56mm/px · 4 of 95 frames shown]
[frame 15/95]
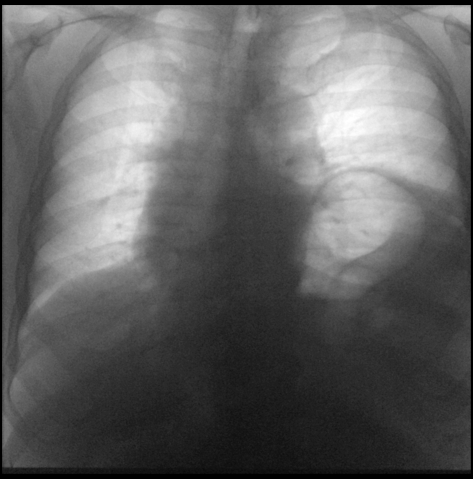
[frame 48/95]
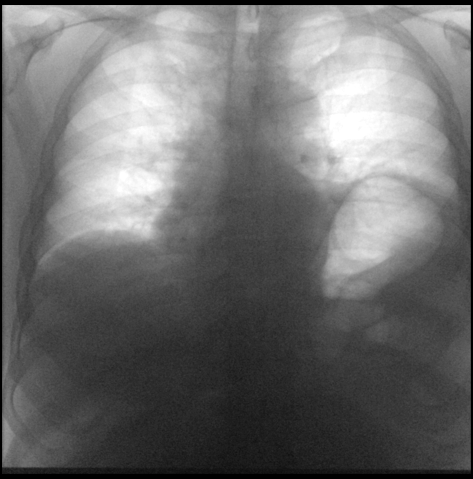
[frame 81/95]
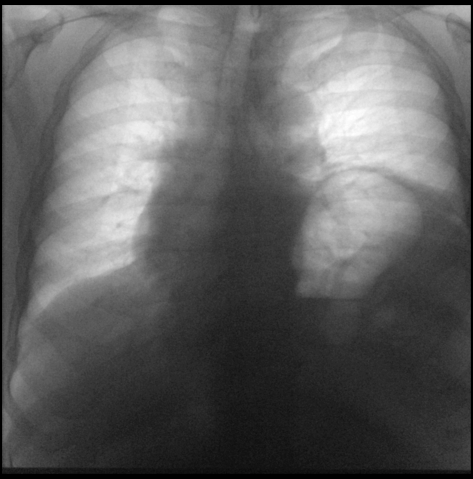
[frame 88/95]
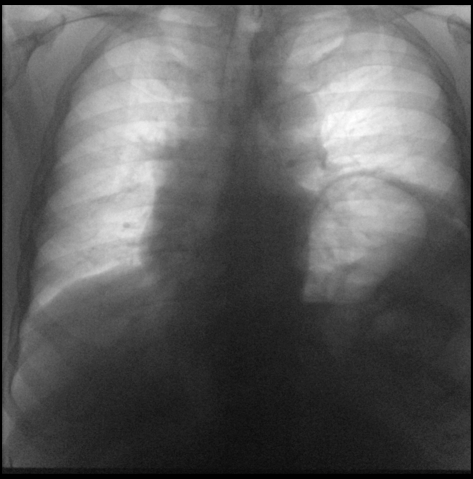

[4 of 4 positions shown; findings below may reference images not displayed]

FINDINGS: There is paradoxical motion of the left hemidiaphragm with sniffing.
Normal excursion of the right hemidiaphragm.
IMPRESSION: Paralysis of the left hemidiaphragm.

## 2019-08-07 ENCOUNTER — Telehealth: Payer: Self-pay | Admitting: Primary Care

## 2019-08-07 NOTE — Telephone Encounter (Signed)
Spoke with pt. He is aware of Tammy's recommendation. Nothing further was needed.

## 2019-08-07 NOTE — Telephone Encounter (Signed)
Sorry to hear that he is still sick was seen by nurse practitioner Clent Ridges 2 weeks ago given antibiotics and now once again given antibiotics and prednisone by his primary care provider patient needs to be seen with further evaluation and possible imaging as has no chest x-ray on record for greater than 1 year and most likely may need a sputum culture. Unfortunately we do not have any openings will need to call his primary care provider back for further evaluation and/or see urgent care or seek emergency room care  Please contact office for sooner follow up if symptoms do not improve or worsen or seek emergency care '

## 2019-08-07 NOTE — Telephone Encounter (Signed)
Spoke with pt. States that he is not feeling well. Reports increased chest congestion, coughing and sinus congestion. Cough is producing clear mucus. Denies fever, shortness of breath, chest tightness, wheezing or sick contacts. Pt saw his PCP for this about 2-3 weeks and was given antibiotic and prednisone taper. Feels like symptoms have gotten worse over the past few days. Would like recommendations.  TP - please advise. Thanks.

## 2019-10-08 ENCOUNTER — Ambulatory Visit (INDEPENDENT_AMBULATORY_CARE_PROVIDER_SITE_OTHER): Payer: Medicare Other | Admitting: Allergy & Immunology

## 2019-10-08 ENCOUNTER — Encounter: Payer: Self-pay | Admitting: Allergy & Immunology

## 2019-10-08 ENCOUNTER — Other Ambulatory Visit: Payer: Self-pay

## 2019-10-08 VITALS — BP 136/72 | HR 74 | Temp 98.6°F | Resp 22 | Ht 66.0 in | Wt 200.0 lb

## 2019-10-08 DIAGNOSIS — K9049 Malabsorption due to intolerance, not elsewhere classified: Secondary | ICD-10-CM | POA: Diagnosis not present

## 2019-10-08 DIAGNOSIS — J4530 Mild persistent asthma, uncomplicated: Secondary | ICD-10-CM

## 2019-10-08 DIAGNOSIS — J310 Chronic rhinitis: Secondary | ICD-10-CM

## 2019-10-08 MED ORDER — FAMOTIDINE 40 MG PO TABS: 40.0000 mg | ORAL_TABLET | Freq: Two times a day (BID) | ORAL | 2 refills | Status: DC

## 2019-10-08 MED ORDER — PANTOPRAZOLE SODIUM 40 MG PO TBEC: 40.0000 mg | DELAYED_RELEASE_TABLET | Freq: Every day | ORAL | 2 refills | Status: DC

## 2019-10-08 MED ORDER — ARNUITY ELLIPTA 100 MCG/ACT IN AEPB: 1.0000 | INHALATION_SPRAY | Freq: Every day | RESPIRATORY_TRACT | 2 refills | Status: DC

## 2019-10-08 NOTE — Patient Instructions (Addendum)
1. Mild persistent asthma, uncomplicated - Lung testing actually looked fairly good. - I would like you to use your Arnuity daily to see if this helps at all with your symptoms. - Once we get the symptoms under control, we can take off medications. - Daily controller medication(s): Arnuity one puff once daily - Prior to physical activity: albuterol 2 puffs or ProAir 2 puffs 10-15 minutes before physical activity. - Rescue medications: ProAir 4 puffs every 4-6 hours as needed - Asthma control goals:  * Full participation in all desired activities (may need albuterol before activity) * Albuterol use two time or less a week on average (not counting use with activity) * Cough interfering with sleep two time or less a month * Oral steroids no more than once a year * No hospitalizations  2. Food intolerance - Testing to the most common foods was negative. - There is a the low positive predictive value of food allergy testing and hence the high possibility of false positives. - In contrast, food allergy testing has a high negative predictive value, therefore if testing is negative we can be relatively assured that they are indeed negative.  - I do not think your reactions are due to foods. - Instead I think your reflux is not under good control. - Stop the Nexium. - Start Protonix 40mg  daily. - Start Pepcid 40mg  twice daily.  - We can decrease medicines at the next visit.   3. Chronic rhinitis - Testing today showed: negative to the entire panel - Copy of test results provided.  - Continue with: Flonase (fluticasone) two sprays per nostril daily (but USE EVERY DAY for the best effect)  4. Return in about 8 weeks (around 12/03/2019).    Please inform of any Emergency Department visits, hospitalizations, or changes in symptoms. Call 14/02/2019 before going to the ED for breathing or allergy symptoms since we might be able to fit you in for a sick visit. Feel free to contact us anytime with  any questions, problems, or concerns.  It was a pleasure to meet you today!  Websites that have reliable patient information: 1. American Academy of Asthma, Allergy, and Immunology: www.aaaai.org 2. Food Allergy Research and Education (FARE): foodallergy.org 3. Mothers of Asthmatics: http://www.asthmacommunitynetwork.org 4. American College of Allergy, Asthma, and Immunology: www.acaai.org   COVID-19 Vaccine Information can be found at: [place] For questions related to vaccine distribution or appointments, please email [email or web address] or call [phone].     "Like" [email or web address] on Facebook and Instagram for our latest updates!     HAPPY FALL!     Make sure you are registered to vote! If you have moved or changed any of your contact information, you will need to get this updated before voting!  In some cases, you MAY be able to register to vote online: [phone]

## 2019-10-08 NOTE — Progress Notes (Signed)
NEW PATIENT  Date of Service/Encounter:  10/08/19  Referring provider: Courtney Paris, NP   Assessment:   Mild persistent asthma, uncomplicated   Food intolerance  Chronic rhinitis  GERD   [name] presents for evaluation of coughing and sneezing after eating.  There is no itching or hives involved with this, so I do not think this is a food allergy.  However, we did test for the most common foods and these were negative.  This is more to do with a combination of respiratory rhinitis with uncontrolled GERD.  We are going to aggressively treat the GERD to see if this provides any relief at the next visit.  We are also going to try to keep the inhaled steroid on board to see if that provides any relief as well.  I also recommended doing the Flonase on a more regular basis.  This does feel like a lot of medications, but once his symptoms are under control, we can start taking some medicines off.  Plan/Recommendations:   1. Mild persistent asthma, uncomplicated - Lung testing actually looked fairly good. - I would like you to use your Arnuity daily to see if this helps at all with your symptoms. - Once we get the symptoms under control, we can take off medications. - Daily controller medication(s): Arnuity one puff once daily - Prior to physical activity: albuterol 2 puffs or ProAir 2 puffs 10-15 minutes before physical activity. - Rescue medications: ProAir 4 puffs every 4-6 hours as needed - Asthma control goals:  * Full participation in all desired activities (may need albuterol before activity) * Albuterol use two time or less a week on average (not counting use with activity) * Cough interfering with sleep two time or less a month * Oral steroids no more than once a year * No hospitalizations  2. Food intolerance - Testing to the most common foods was negative. - There is a the low positive predictive value of food allergy testing and hence the high possibility of  false positives. - In contrast, food allergy testing has a high negative predictive value, therefore if testing is negative we can be relatively assured that they are indeed negative.  - I do not think your reactions are due to foods. - Instead I think your reflux is not under good control. - Stop the Nexium. - Start Protonix  daily. - Start Pepcid  twice daily.  - We can decrease medicines at the next visit.   3. Chronic rhinitis - Testing today showed: negative to the entire panel - Copy of test results provided.  - Continue with: Flonase (fluticasone) two sprays per nostril daily (but USE EVERY DAY for the best effect)  4. Return in about 8 weeks (around 12/03/2019).    Subjective:   Tyler Tanner is a 74 y.o. male presenting today for evaluation of  Chief Complaint  Patient presents with  . Nasal Congestion  . Cough  . Gastroesophageal Reflux  . Bronchitis    Tyler Tanner has a history of the following: Patient Active Problem List   Diagnosis Date Noted  . Asthmatic bronchitis with acute exacerbation 11/21/2017  . Increased prostate specific antigen (PSA) velocity 05/03/2017  . Screen for colon cancer 05/02/2017  . History of elevated PSA 05/02/2017  . Benign prostatic hyperplasia with urinary hesitancy 05/02/2017  . Reactive airway disease 02/07/2017  . Nasal congestion due to prolonged use of decongestants 02/07/2017  . COPD 11/14/2006  . OSTEOARTHRITIS 11/14/2006  .  NECK PAIN 11/14/2006  . LOW BACK PAIN 11/14/2006  . Bronchitis, acute 09/10/2006  . Cough 09/01/2006    History obtained from: chart review and patient.  Tyler Tanner was referred by Courtney Paris, NP.     Tyler Tanner is a 74 y.o. male presenting for an evaluation of cough and congestion.   Asthma/Respiratory Symptom History: He has had breathing issues for three years. He has seen a lung doctor. He was placed on a couple of antibiotics and prednisone weans. His concern today is that  every time that he eats, around one minute or less after he finishes eating, he starts sneezing and then he starts to have congestion and he starts coughing a lot. It does not seem to matter what he eats. It might be a certain food. He does have a rescue inhaler and an albuterol nebulizer. He rarely needs to use the nebulizer at all. He will use the MDI 1-2 times per week. He can go 3-4 weeks without having to use it. He was a smoker around 45 years ago. He did get a Breo to use, but it was "too strong". He was changed to Arnuity instead, but he stopped using this because he was feeling better. His biggest problem is when he eats. EVERY time that he eats, he starts having the congestion and the SOB with sneezing.   Allergic Rhinitis Symptom History: He does not have some intermittent watery eyes and rhinorrhea. This is not a constant thing however. He does not recall having allergies when he was a child. He did have "croup" a lot when he was younger.   Food Allergy Symptom History: He reports coughing with particular foods. He does this with just about anything that he eats. He denies throat swelling from this and he has never needed to go to the hospital. He will take some cough medicine and walk outside for fresh air. He has intermittent heart burn. He does have a lot of belching. He does take Nexium every single day. He has never been on famotidine to his knowledge.   Otherwise, there is no history of other atopic diseases, including drug allergies, stinging insect allergies, eczema, urticaria or contact dermatitis. There is no significant infectious history. Vaccinations are up to date.    Past Medical History: Patient Active Problem List   Diagnosis Date Noted  . Asthmatic bronchitis with acute exacerbation 11/21/2017  . Increased prostate specific antigen (PSA) velocity 05/03/2017  . Screen for colon cancer 05/02/2017  . History of elevated PSA 05/02/2017  . Benign prostatic hyperplasia with  urinary hesitancy 05/02/2017  . Reactive airway disease 02/07/2017  . Nasal congestion due to prolonged use of decongestants 02/07/2017  . COPD 11/14/2006  . OSTEOARTHRITIS 11/14/2006  . NECK PAIN 11/14/2006  . LOW BACK PAIN 11/14/2006  . Bronchitis, acute 09/10/2006  . Cough 09/01/2006    Medication List:  Allergies as of 10/08/2019   No Known Allergies     Medication List       Accurate as of October 08, 2019 11:59 PM. If you have any questions, ask your nurse or doctor.        amLODipine 10 MG tablet Commonly known as: NORVASC Take 10 mg by mouth daily.   Arnuity Ellipta 100 MCG/ACT Aepb Generic drug: Fluticasone Furoate Inhale 1 puff into the lungs daily. Started by: Alfonse Spruce, MD   clindamycin 300 MG capsule Commonly known as: CLEOCIN Take 1 capsule (300 mg total) by mouth 3 (three) times  daily.   famotidine 40 MG tablet Commonly known as: Pepcid Take 1 tablet (40 mg total) by mouth 2 (two) times daily. Started by: Alfonse SpruceJoel Louis Deaven Urwin, MD   fluticasone 50 MCG/ACT nasal spray Commonly known as: FLONASE Place 2 sprays into both nostrils daily.   omeprazole 20 MG capsule Commonly known as: PRILOSEC Take 1 capsule every morning at least 30 minutes before first dose of Carafate.   pantoprazole 40 MG tablet Commonly known as: Protonix Take 1 tablet (40 mg total) by mouth daily. Started by: Alfonse SpruceJoel Louis Saudia Smyser, MD   predniSONE 10 MG tablet Commonly known as: DELTASONE Take 4 tabs po daily x 2 days; then 3 tabs for 2 days; then 2 tabs for 2 days; then 1 tab for 2 days   ProAir HFA 108 (90 Base) MCG/ACT inhaler Generic drug: albuterol Inhale 2 puffs into the lungs every 6 (six) hours as needed. What changed: reasons to take this   sodium chloride 0.65 % Soln nasal spray Commonly known as: OCEAN Place 1 spray into both nostrils as needed for congestion.   sucralfate 1 g tablet Commonly known as: Carafate Take 1 tablet (1 g total) by mouth 4  (four) times daily -  with meals and at bedtime.   tamsulosin 0.4 MG Caps capsule Commonly known as: Flomax Take 1 capsule (0.4 mg total) by mouth daily after supper.       Birth History: non-contributory  Developmental History: non-contributory  Past Surgical History: Past Surgical History:  Procedure Laterality Date  . TONSILLECTOMY       Family History: Family History  Problem Relation Age of Onset  . Diabetes Mother   . Allergic rhinitis Mother   . COPD Sister   . Coronary artery disease Brother      Social History: Alinda MoneyMelvin lives at home with his family.  They live in a house that is 72 years old.  They have carpeting throughout the home.  They have gas heating and central cooling.  There is 1 dog inside the home.  There are no dust mite covers on the bedding.  There is no tobacco exposure.  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   Review of Systems  Constitutional: Negative.  Negative for chills, fever, malaise/fatigue and weight loss.  HENT: Negative.  Negative for congestion, ear discharge and ear pain.        Positive for sneezing.  Eyes: Negative for pain, discharge and redness.  Respiratory: Positive for cough. Negative for sputum production, shortness of breath and wheezing.   Cardiovascular: Negative.  Negative for chest pain and palpitations.  Gastrointestinal: Negative for abdominal pain, constipation, diarrhea, heartburn, nausea and vomiting.  Skin: Negative.  Negative for itching and rash.  Neurological: Negative for dizziness and headaches.  Endo/Heme/Allergies: Negative for environmental allergies. Does not bruise/bleed easily.       Objective:   Blood pressure 136/72, pulse 74, temperature 98.6 F (37 C), temperature source Temporal, resp. rate (!) 22, height 5\' 6"  (1.676 m), weight 200 lb (90.7 kg), SpO2 95 %. Body mass index is 32.28 kg/m.   Physical Exam:   Physical Exam Constitutional:        Appearance: He is well-developed.     Comments: Pleasant boisterous male.  HENT:     Head: Normocephalic and atraumatic.     Right Ear: Tympanic membrane, ear canal and external ear normal. No drainage, swelling or tenderness. Tympanic membrane is not injected,  scarred, erythematous, retracted or bulging.     Left Ear: Tympanic membrane, ear canal and external ear normal. No drainage, swelling or tenderness. Tympanic membrane is not injected, scarred, erythematous, retracted or bulging.     Nose: No nasal deformity, septal deviation, mucosal edema or rhinorrhea.     Right Sinus: No maxillary sinus tenderness or frontal sinus tenderness.     Left Sinus: No maxillary sinus tenderness or frontal sinus tenderness.     Mouth/Throat:     Mouth: Mucous membranes are not pale and not dry.     Pharynx: Uvula midline.  Eyes:     General:        Right eye: No discharge.        Left eye: No discharge.     Conjunctiva/sclera: Conjunctivae normal.     Right eye: Right conjunctiva is not injected. No chemosis.    Left eye: Left conjunctiva is not injected. No chemosis.    Pupils: Pupils are equal, round, and reactive to light.  Cardiovascular:     Rate and Rhythm: Normal rate and regular rhythm.     Heart sounds: Normal heart sounds.  Pulmonary:     Effort: Pulmonary effort is normal. No tachypnea, accessory muscle usage or respiratory distress.     Breath sounds: Examination of the right-upper field reveals rhonchi. Examination of the left-upper field reveals rhonchi. Rhonchi present. No wheezing or rales.     Comments: Overall, his lung sounded fairly good. Chest:     Chest wall: No tenderness.  Abdominal:     Tenderness: There is no abdominal tenderness. There is no guarding or rebound.  Lymphadenopathy:     Head:     Right side of head: No submandibular, tonsillar or occipital adenopathy.     Left side of head: No submandibular, tonsillar or occipital adenopathy.     Cervical: No  cervical adenopathy.  Skin:    Coloration: Skin is not pale.     Findings: No abrasion, erythema, petechiae or rash. Rash is not papular, urticarial or vesicular.     Comments: No eczematous or urticarial lesions noted.  Neurological:     Mental Status: He is alert.      Diagnostic studies:   Spirometry: results normal (FEV1: 2.06/76%, FVC: 2.92/78%, FEV1/FVC: 71%).    Spirometry consistent with normal pattern.   Allergy Studies:     Airborne Adult Perc - 10/08/19 1419    Time Antigen Placed 1419    Allergen Manufacturer Waynette Buttery    Location Back    Number of Test 59    Panel 1 Select    1. Control-Buffer 50% Glycerol Negative    2. Control-Histamine 1 mg/ml 2+    3. Albumin saline Negative    4. Bahia Negative    5. French Southern Territories Negative    6. Johnson Negative    7. Kentucky Blue Negative    8. Meadow Fescue Negative    9. Perennial Rye Negative    10. Sweet Vernal Negative    11. Timothy Negative    12. Cocklebur Negative    13. Burweed Marshelder Negative    14. Ragweed, short Negative    15. Ragweed, Giant Negative    16. Plantain,  English Negative    17. Lamb's Quarters Negative    18. Sheep Sorrell Negative    19. Rough Pigweed Negative    20. Marsh Elder, Rough Negative    21. Mugwort, Common Negative    22. Ash mix Negative  23Charletta Cousin mix Negative    24. Beech American Negative    25. Box, Elder Negative    26. Cedar, red Negative    27. Cottonwood, Guinea-Bissau Negative    28. Elm mix Negative    29. Hickory Negative    30. Maple mix Negative    31. Oak, Guinea-Bissau mix Negative    32. Pecan Pollen Negative    33. Pine mix Negative    34. Sycamore Eastern Negative    35. Walnut, Black Pollen Negative    36. Alternaria alternata Negative    37. Cladosporium Herbarum Negative    38. Aspergillus mix Negative    39. Penicillium mix Negative    40. Bipolaris sorokiniana (Helminthosporium) Negative    41. Drechslera spicifera (Curvularia) Negative    42. Mucor  plumbeus Negative    43. Fusarium moniliforme Negative    44. Aureobasidium pullulans (pullulara) Negative    45. Rhizopus oryzae Negative    46. Botrytis cinera Negative    47. Epicoccum nigrum Negative    48. Phoma betae Negative    49. Candida Albicans Negative    50. Trichophyton mentagrophytes Negative    51. Mite, D Farinae  5,000 AU/ml Negative    52. Mite, D Pteronyssinus  5,000 AU/ml Negative    53. Cat Hair 10,000 BAU/ml Negative    54.  Dog Epithelia Negative    55. Mixed Feathers Negative    56. Horse Epithelia Negative    57. Cockroach, German Negative    58. Mouse Negative    59. Tobacco Leaf Negative          Food Perc - 10/08/19 1419      Test Information   Time Antigen Placed 1419    Allergen Manufacturer Waynette Buttery    Location Back    Number of allergen test 10    Food Select      Food   1. Peanut Negative    2. Soybean food Negative    3. Wheat, whole Negative    4. Sesame Negative    5. Milk, cow Negative    6. Egg White, chicken Negative    7. Casein Negative    8. Shellfish mix Negative    9. Fish mix Negative    10. Cashew Negative           Allergy testing results were read and interpreted by myself, documented by clinical staff.         Malachi Bonds, MD [facility]

## 2019-10-09 ENCOUNTER — Encounter: Payer: Self-pay | Admitting: Allergy & Immunology

## 2019-12-03 ENCOUNTER — Ambulatory Visit: Payer: Medicare Other | Admitting: Allergy & Immunology

## 2019-12-19 ENCOUNTER — Emergency Department (HOSPITAL_COMMUNITY)
Admission: EM | Admit: 2019-12-19 | Discharge: 2019-12-19 | Disposition: A | Discharge status: Home / Self Care | Payer: Medicare Other | Attending: Emergency Medicine | Admitting: Emergency Medicine

## 2019-12-19 ENCOUNTER — Other Ambulatory Visit: Payer: Self-pay

## 2019-12-19 DIAGNOSIS — R39198 Other difficulties with micturition: Secondary | ICD-10-CM | POA: Diagnosis present

## 2019-12-19 DIAGNOSIS — J45901 Unspecified asthma with (acute) exacerbation: Secondary | ICD-10-CM | POA: Diagnosis not present

## 2019-12-19 DIAGNOSIS — J449 Chronic obstructive pulmonary disease, unspecified: Secondary | ICD-10-CM | POA: Insufficient documentation

## 2019-12-19 DIAGNOSIS — N400 Enlarged prostate without lower urinary tract symptoms: Secondary | ICD-10-CM | POA: Insufficient documentation

## 2019-12-19 DIAGNOSIS — Z79899 Other long term (current) drug therapy: Secondary | ICD-10-CM | POA: Insufficient documentation

## 2019-12-19 DIAGNOSIS — Z87891 Personal history of nicotine dependence: Secondary | ICD-10-CM | POA: Insufficient documentation

## 2019-12-19 LAB — BASIC METABOLIC PANEL
Anion gap: 9 (ref 5–15)
BUN: 20 mg/dL (ref 8–23)
CO2: 25 mmol/L (ref 22–32)
Calcium: 8.9 mg/dL (ref 8.9–10.3)
Chloride: 106 mmol/L (ref 98–111)
Creatinine, Ser: 1.2 mg/dL (ref 0.61–1.24)
GFR, Estimated: >60 mL/min (ref >60)
Glucose, Bld: 114 mg/dL — ABNORMAL HIGH (ref 70–99)
Potassium: 4.1 mmol/L (ref 3.5–5.1)
Sodium: 140 mmol/L (ref 135–145)

## 2019-12-19 LAB — CBC
HCT: 45.5 % (ref 39.0–52.0)
Hemoglobin: 15.5 g/dL (ref 13.0–17.0)
MCH: 30.8 pg (ref 26.0–34.0)
MCHC: 34.1 g/dL (ref 30.0–36.0)
MCV: 90.5 fL (ref 80.0–100.0)
Platelets: 193 10*3/uL (ref 150–400)
RBC: 5.03 MIL/uL (ref 4.22–5.81)
RDW: 12.8 % (ref 11.5–15.5)
WBC: 7 10*3/uL (ref 4.0–10.5)
nRBC: 0 % (ref 0.0–0.2)

## 2019-12-19 LAB — URINALYSIS, ROUTINE W REFLEX MICROSCOPIC
Bilirubin Urine: NEGATIVE
Glucose, UA: NEGATIVE mg/dL
Hgb urine dipstick: NEGATIVE
Ketones, ur: NEGATIVE mg/dL
Leukocytes,Ua: NEGATIVE
Nitrite: NEGATIVE
Protein, ur: NEGATIVE mg/dL
Specific Gravity, Urine: 1.017 (ref 1.005–1.030)
pH: 6 (ref 5.0–8.0)

## 2019-12-19 LAB — I-STAT BETA HCG BLOOD, ED (MC, WL, AP ONLY): I-stat hCG, quantitative: <5 m[IU]/mL (ref <5)

## 2019-12-19 NOTE — ED Notes (Signed)
Went into pt room and pt stated he was read to go.  Pt informed that we were still waiting on blood work to finish running and that discharge instructions were not prepared.  Pt would have to wait for that.  Pt stated that he no longer wants to wait and that he was ready to go now.  Dr. Effie Shy and Debarah Crape, [place] were made aware of pt leaving.

## 2019-12-19 NOTE — ED Triage Notes (Signed)
Pt states that he usually has some difficulty urinating but today it has been harder than normal and feels like "something is blocking" when he tries to pee. Alert and oriented.

## 2019-12-19 NOTE — ED Provider Notes (Signed)
[facility]-EMERGENCY DEPT Provider Note   CSN: [id number] Arrival date & time: 12/19/19  1610     History No chief complaint on file.   Tyler Tanner is a 74 y.o. male presents to the ED for evaluation of difficulty urinating.  Sudden onset this morning.  Reports being on Flomax for several years.  Usually he has a hard time initiating urine stream in the morning but eventually resolves throughout the day.  This morning he woke up and had difficulty urinating that lasted throughout the day.  Reports weak stream as well as dribbling.  Associated with discomfort in the lower abdomen and having the urge to urinate but unable to.  Denies fever, nausea, vomiting, dysuria, hematuria.  Denies back injuries, saddle anesthesia or new weakness or numbness in his extremities.  No known kidney disease.  Does not take any diuretics.  States his primary care doctor recently did lab work and told him his kidney function was normal.  HPI     Past Medical History:  Diagnosis Date  . Asthma   . GERD (gastroesophageal reflux disease)   . Recurrent upper respiratory infection (URI)     Patient Active Problem List   Diagnosis Date Noted  . Asthmatic bronchitis with acute exacerbation 11/21/2017  . Increased prostate specific antigen (PSA) velocity 05/03/2017  . Screen for colon cancer 05/02/2017  . History of elevated PSA 05/02/2017  . Benign prostatic hyperplasia with urinary hesitancy 05/02/2017  . Reactive airway disease 02/07/2017  . Nasal congestion due to prolonged use of decongestants 02/07/2017  . COPD 11/14/2006  . OSTEOARTHRITIS 11/14/2006  . NECK PAIN 11/14/2006  . LOW BACK PAIN 11/14/2006  . Bronchitis, acute 09/10/2006  . Cough 09/01/2006    Past Surgical History:  Procedure Laterality Date  . TONSILLECTOMY         Family History  Problem Relation Age of Onset  . Diabetes Mother   . Allergic rhinitis Mother   . COPD Sister   . Coronary artery  disease Brother     Social History   Tobacco Use  . Smoking status: Former Smoker    Packs/day: 1.00    Years: 12.00    Pack years: 12.00    Types: Cigarettes    Quit date: 07/25/1975    Years since quitting: 44.4  . Smokeless tobacco: Never Used  Vaping Use  . Vaping Use: Never used  Substance Use Topics  . Alcohol use: No  . Drug use: No    Home Medications Prior to Admission medications   Medication Sig Start Date End Date Taking? Authorizing Provider  amLODipine (NORVASC) 10 MG tablet Take 10 mg by mouth daily. 06/30/19   [provider]  clindamycin (CLEOCIN) 300 MG capsule Take 1 capsule (300 mg total) by mouth 3 (three) times daily. Patient not taking: Reported on 10/08/2019 07/14/19   Glenford Bayley, NP  famotidine (PEPCID) 40 MG tablet Take 1 tablet (40 mg total) by mouth 2 (two) times daily. 10/08/19   Alfonse Spruce, MD  fluticasone [facility]) 50 MCG/ACT nasal spray Place 2 sprays into both nostrils daily. Patient not taking: Reported on 10/08/2019 02/07/17   Mliss Sax, MD  Fluticasone Furoate (ARNUITY ELLIPTA) 100 MCG/ACT AEPB Inhale 1 puff into the lungs daily. 10/08/19   Alfonse Spruce, MD  omeprazole (PRILOSEC) 20 MG capsule Take 1 capsule every morning at least 30 minutes before first dose of Carafate. 05/04/18   Molpus, Jonny Ruiz, MD  pantoprazole (PROTONIX)  40 MG tablet Take 1 tablet (40 mg total) by mouth daily. 10/08/19   Alfonse Spruce, MD  predniSONE (DELTASONE) 10 MG tablet Take 4 tabs po daily x 2 days; then 3 tabs for 2 days; then 2 tabs for 2 days; then 1 tab for 2 days Patient not taking: Reported on 10/08/2019 07/14/19   Glenford Bayley, NP  PROAIR HFA 108 [phone] Base) MCG/ACT inhaler Inhale 2 puffs into the lungs every 6 (six) hours as needed. Patient taking differently: Inhale 2 puffs into the lungs every 6 (six) hours as needed for wheezing.  12/26/17   Kalman Shan, MD  sodium chloride (OCEAN) 0.65 % SOLN nasal spray  Place 1 spray into both nostrils as needed for congestion. 07/14/19   Glenford Bayley, NP  sucralfate (CARAFATE) 1 g tablet Take 1 tablet (1 g total) by mouth 4 (four) times daily -  with meals and at bedtime. Patient not taking: Reported on 07/14/2019 05/04/18   Molpus, Jonny Ruiz, MD  tamsulosin (FLOMAX) 0.4 MG CAPS capsule Take 1 capsule (0.4 mg total) by mouth daily after supper. 03/27/16   Shaune Pollack, MD    Allergies    Patient has no known allergies.  Review of Systems   Review of Systems  Genitourinary: Positive for decreased urine volume and difficulty urinating.  All other systems reviewed and are negative.   Physical Exam Updated Vital Signs BP 132/69   Pulse (!) 55   Temp 98.1 F (36.7 C)   Resp (!) 22   Ht 5\' 6"  (1.676 m)   Wt 90.7 kg   SpO2 95%   BMI 32.28 kg/m   Physical Exam Vitals and nursing note reviewed.  Constitutional:      Appearance: He is well-developed.     Comments: Non toxic.  HENT:     Head: Normocephalic and atraumatic.     Nose: Nose normal.  Eyes:     Conjunctiva/sclera: Conjunctivae normal.  Cardiovascular:     Rate and Rhythm: Normal rate and regular rhythm.     Heart sounds: Normal heart sounds.  Pulmonary:     Effort: Pulmonary effort is normal.     Breath sounds: Normal breath sounds.  Abdominal:     General: Bowel sounds are normal.     Palpations: Abdomen is soft.     Tenderness: There is no abdominal tenderness.     Comments: No significant distention noted. No G/R/R. No suprapubic or CVA tenderness.   Genitourinary:    Comments: Normal external genitalia, uncircumcised. Normal perianal skin. Enlarged prostate soft to firm, no bogginess, no tenderness.  Musculoskeletal:        General: Normal range of motion.     Cervical back: Normal range of motion.  Skin:    General: Skin is warm and dry.     Capillary Refill: Capillary refill takes less than 2 seconds.  Neurological:     Mental Status: He is alert.  Psychiatric:         Behavior: Behavior normal.     ED Results / Procedures / Treatments   Labs (all labs ordered are listed, but only abnormal results are displayed) Labs Reviewed  BASIC METABOLIC PANEL - Abnormal; Notable for the following components:      Result Value   Glucose, Bld 114 (*)    All other components within normal limits  URINALYSIS, ROUTINE W REFLEX MICROSCOPIC  CBC  I-STAT CHEM 8, ED  I-STAT BETA HCG BLOOD, ED ([facility], [facility], [facility] ONLY)  EKG None  Radiology No results found.  Procedures Procedures (including critical care time)  Medications Ordered in ED Medications - No data to display  ED Course  I have reviewed the triage vital signs and the nursing notes.  Pertinent labs & imaging results that were available during my care of the patient were reviewed by me and considered in my medical decision making (see chart for details).    MDM Rules/Calculators/A&P                          74 year old male with difficulty urinating.  On Flomax for a long time due to weak stream and hesitancy in the mornings.  EMR, triage nursing notes reviewed  On exam patient has no suprapubic distention or tenderness.  Afebrile.  Enlarged prostate without tenderness, bogginess.  Urination has significantly improved while in the ED and patient has urinated multiple times.  Prevoid bladder scan with 290 mL, with PVR 110 mL.  Lab work, urinalysis as above  Labs personally visualized and interpreted  Labs-unremarkable, creatinine normal.  No leukocytosis.  UA without infection.  Given benign work-up and resolution of symptoms, will discharge patient.  No indication for Foley catheter.  DDx includes BPH or other prostate pathology causing transient urinary retention.  On chart review patient had elevated PSA 2 years ago.  2025: RN notified me patient eloped.  I called patient back and discussed with his ER lab work, urinalysis.  Recommended urology follow-up and PCP follow-up for PSA check.  Return  precautions discussed.  Patient is comfortable this plan.  Shared with EDP. Final Clinical Impression(s) / ED Diagnoses Final diagnoses:  Difficulty urinating  Enlarged prostate    Rx / DC Orders ED Discharge Orders    None       Jerrell Mylar 12/19/19 2026    Mancel Bale, MD 12/20/19 1149

## 2019-12-19 NOTE — ED Notes (Addendum)
Lab verbalizes possession of blood to run CBC and BMP.

## 2019-12-20 NOTE — ED Provider Notes (Signed)
  Face-to-face evaluation   History: He presents for evaluation of difficulty urinating characterized by frequent episodes of urination with dribbling post voiding.  He takes Flomax daily and feels like his symptoms improved after dinner taking Flomax today.  He denies fever, chills, vomiting or dizziness.  Physical exam: Obese male alert and cooperative.  No respiratory distress.  He does not appear uncomfortable.  Anus/rectal exam by me, prostate enlarged, 2 times normal, firm without bogginess or tenderness.  Small amount of brown stool in rectal vault.  Medical screening examination/treatment/procedure(s) were conducted as a shared visit with non-physician practitioner(s) and myself.  I personally evaluated the patient during the encounter    Mancel Bale, MD 12/20/19 1148

## 2020-01-04 ENCOUNTER — Other Ambulatory Visit: Payer: Self-pay | Admitting: Allergy & Immunology

## 2020-01-15 ENCOUNTER — Ambulatory Visit: Payer: Medicare Other | Admitting: Primary Care

## 2020-01-18 ENCOUNTER — Other Ambulatory Visit: Payer: Self-pay | Admitting: Allergy and Immunology

## 2023-10-07 ENCOUNTER — Ambulatory Visit: Payer: Self-pay

## 2023-10-07 NOTE — Telephone Encounter (Signed)
 FYI Only or Action Required?: FYI only for provider. Appt made for 10/7   Patient is followed in Pulmonology for Chronic Bronchitis, last seen on 2021  Called Nurse Triage reporting Shortness of Breath.  Symptoms began several weeks ago.  Interventions attempted: Nothing.  Symptoms are: stable.  Triage Disposition: See HCP Within 4 Hours (Or PCP Triage) Appt in the morning- patient states not emergent  Patient/caregiver understands and will follow disposition?: YesCopied from CRM [id number]. Topic: Clinical - Red Word Triage >> Oct 07, 2023  1:56 PM Leila C wrote: Red Word that prompted transfer to Nurse Triage: Patient [phone] states coughing sounds like croup, hard to breathe, short winded shortness of breath started a few weeks ago. Patient denies dizziness, pain, nor fever. Patient wants to schedule an appointment with Dr. Geronimo. Informed patient last saw Dr. Geronimo 12/05/17 and NP, Hope 07/14/19. Please advise. Reason for Disposition  [1] MILD difficulty breathing (e.g., minimal/no SOB at rest, SOB with walking, pulse < 100) AND [2] NEW-onset or WORSE than normal  Answer Assessment - Initial Assessment Questions Patient previously established with Dr Geronimo and Almarie Hope, NP. Pt with croupy cough in mornings and in the evening. SOB with activity. Not enough to stop him during the activity but feels winded after and has to rest.   Started thyroid medicine in the last month- stopped due to increase in symptoms about 2 weeks ago. Mild but not complete resolution of symptoms.   1. RESPIRATORY STATUS: Describe your breathing? (e.g., wheezing, shortness of breath, unable to speak, severe coughing)      SOB with activity 2. ONSET: When did this breathing problem begin?      A couple weeks ago 3. PATTERN Does the difficult breathing come and go, or has it been constant since it started?      Comes on with activity 4. SEVERITY: How bad is your breathing? (e.g.,  mild, moderate, severe)      Mild with activity 5. RECURRENT SYMPTOM: Have you had difficulty breathing before? If Yes, ask: When was the last time? and What happened that time?      2020 6. CARDIAC HISTORY: Do you have any history of heart disease? (e.g., heart attack, angina, bypass surgery, angioplasty)      denies 7. LUNG HISTORY: Do you have any history of lung disease?  (e.g., pulmonary embolus, asthma, emphysema)     Seen by [facility] for SOB/Wheeze back in 2019-2020 and prescribed prednisone  and breathing treatments/inhaler 8. CAUSE: What do you think is causing the breathing problem?      Unsure- similar to last time 9. OTHER SYMPTOMS: Do you have any other symptoms? (e.g., chest pain, cough, dizziness, fever, runny nose)     denies 10. O2 SATURATION MONITOR:  Do you use an oxygen saturation monitor (pulse oximeter) at home? If Yes, ask: What is your reading (oxygen level) today? What is your usual oxygen saturation reading? (e.g., 95%)       Denies having a way to check it 12. TRAVEL: Have you traveled out of the country in the last month? (e.g., travel history, exposures)       denies  Protocols used: Breathing Difficulty-A-AH

## 2023-10-08 ENCOUNTER — Other Ambulatory Visit: Payer: Self-pay | Admitting: Primary Care

## 2023-10-08 ENCOUNTER — Ambulatory Visit (INDEPENDENT_AMBULATORY_CARE_PROVIDER_SITE_OTHER)

## 2023-10-08 ENCOUNTER — Ambulatory Visit (INDEPENDENT_AMBULATORY_CARE_PROVIDER_SITE_OTHER): Admitting: Primary Care

## 2023-10-08 ENCOUNTER — Encounter: Payer: Self-pay | Admitting: Primary Care

## 2023-10-08 VITALS — BP 137/83 | HR 69 | Temp 97.4°F | Ht 66.0 in | Wt 194.4 lb

## 2023-10-08 DIAGNOSIS — J4521 Mild intermittent asthma with (acute) exacerbation: Secondary | ICD-10-CM

## 2023-10-08 DIAGNOSIS — K219 Gastro-esophageal reflux disease without esophagitis: Secondary | ICD-10-CM

## 2023-10-08 MED ORDER — OMEPRAZOLE 20 MG PO CPDR: DELAYED_RELEASE_CAPSULE | ORAL | 1 refills | Status: DC

## 2023-10-08 MED ORDER — PROAIR HFA 108 (90 BASE) MCG/ACT IN AERS: 2.0000 | INHALATION_SPRAY | Freq: Four times a day (QID) | RESPIRATORY_TRACT | 5 refills | Status: AC | PRN

## 2023-10-08 MED ORDER — ARNUITY ELLIPTA 100 MCG/ACT IN AEPB: 1.0000 | INHALATION_SPRAY | Freq: Every day | RESPIRATORY_TRACT | 5 refills | Status: DC

## 2023-10-08 MED ORDER — PREDNISONE 10 MG PO TABS: ORAL_TABLET | ORAL | 0 refills | Status: DC

## 2023-10-08 NOTE — Progress Notes (Signed)
 @Patient  ID: Tyler Tanner, male    DOB: 16-Jul-1945, 78 y.o.   MRN: [id number]  Chief Complaint  Patient presents with   Cough    Dry cough, x 2-3 weeks. Denies chest pain and fever.     Referring provider: Leron Millman, NP  HPI: 78 year old male, former smoker quit 1977 (12-pack-year history).  Past medical history significant for chronic bronchitis, COPD, reactive airway disease.  Patient of Dr. Geronimo, last seen in office on 12/05/2017. FENO on 02/13/17 was borderline elevated at 38ppb, this is indeterminate for eosinophilic asthma.  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  This was followed up with a CT scan of his chest which showed the same. He was unable to tolerate Breo in the past due to dizziness.  This was changed to Arnuity which he felt worked well for him.  Previous [facility] pulmonary encounter:  07/14/2019 Patient presents today for an acute office visit with reports of shortness of breath and chest congestion.  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 His cough worsened 1 month ago. He is getting up clear mucus. He stopped using Arnutiy Ellipta inhaler d/t feeling better. He has not been on steroid inhaler for the last year. He uses his rescue inhaler once a week on average. He has associated chest tightness, nasal congestion/pressure which started 4-5 days ago. He has seasonal allergies. He takes mucinex for cough and flonase  nasal as needed.     10/08/2023- Interim hx  Discussed the use of AI scribe software for clinical note transcription with the patient, who gave verbal consent to proceed.  History of Present Illness Tyler Tanner is a 78 year old male with asthma who presents with shortness of breath and cough.  He has been experiencing shortness of breath and a dry cough, particularly at night and in the morning, which started gradually about two to three weeks ago. He associates the onset  of these symptoms with the initiation of a thyroid medication, which he has since discontinued. Despite stopping the medication, the symptoms have persisted.  He has a history of asthma and was previously on Breo, which he could not tolerate due to dizziness, and was switched to Arnuity. He has not been using his inhalers consistently, only using them as needed in the past two to three weeks. He describes his cough as sounding like 'croup.'  He had a chest x-ray approximately four to five months ago. Prednisone  was previously effective in alleviating his cough.  His current medications include Arnuity, which he has not been taking regularly, and amlodipine for blood pressure, which he takes half a tablet of, sometimes not at all. He also mentions having stopped a cholesterol medication, possibly fenofibrate, after two to three months of use.  He quit smoking in 1977 and has a history of reflux, which he manages with omeprazole . He notes that belching sometimes relieves his shortness of breath.  No coughing up blood, fever, or chest pain, but he mentions occasional chest tightness and some relief of shortness of breath with belching.   PFTs 04/2017 >> mixed restriction/obstruction with significant bronchodilator response.  Decreased diffusion capacity.   No Known Allergies  Immunization History  Administered Date(s) Administered   Pneumococcal Conjugate-13 05/24/2015   Pneumococcal Polysaccharide-23 07/25/2017   Td 04/01/2007    Past Medical History:  Diagnosis Date   Asthma    GERD (gastroesophageal reflux disease)    Recurrent upper respiratory infection (  URI)     Tobacco History: Social History   Tobacco Use  Smoking Status Former   Current packs/day: 0.00   Average packs/day: 1 pack/day for 12.0 years (12.0 ttl pk-yrs)   Types: Cigarettes   Start date: 07/25/1963   Quit date: 07/25/1975   Years since quitting: 48.2  Smokeless Tobacco Never   Counseling given: Not  Answered   Outpatient Medications Prior to Visit  Medication Sig Dispense Refill   amLODipine (NORVASC) 10 MG tablet Take 10 mg by mouth daily.     clindamycin  (CLEOCIN ) 300 MG capsule Take 1 capsule (300 mg total) by mouth 3 (three) times daily. 30 capsule 0   famotidine  (PEPCID ) 40 MG tablet TAKE 1 TABLET BY MOUTH TWICE A DAY 180 tablet 0   fluticasone  (FLONASE ) 50 MCG/ACT nasal spray Place 2 sprays into both nostrils daily. 16 g 6   Fluticasone  Furoate (ARNUITY ELLIPTA ) 100 MCG/ACT AEPB Inhale 1 puff into the lungs daily. 30 each 2   pantoprazole  (PROTONIX ) 40 MG tablet TAKE 1 TABLET BY MOUTH EVERY DAY 90 tablet 0   PROAIR  HFA 108 (90 Base) MCG/ACT inhaler Inhale 2 puffs into the lungs every 6 (six) hours as needed. 1 Inhaler 5   sodium chloride  (OCEAN) 0.65 % SOLN nasal spray Place 1 spray into both nostrils as needed for congestion. 60 mL 1   sucralfate  (CARAFATE ) 1 g tablet Take 1 tablet (1 g total) by mouth 4 (four) times daily -  with meals and at bedtime. 40 tablet 0   tamsulosin  (FLOMAX ) 0.4 MG CAPS capsule Take 1 capsule (0.4 mg total) by mouth daily after supper. 14 capsule 0   omeprazole  (PRILOSEC) 20 MG capsule Take 1 capsule every morning at least 30 minutes before first dose of Carafate . (Patient not taking: Reported on 10/08/2023) 30 capsule 0   predniSONE  (DELTASONE ) 10 MG tablet Take 4 tabs po daily x 2 days; then 3 tabs for 2 days; then 2 tabs for 2 days; then 1 tab for 2 days (Patient not taking: Reported on 10/08/2023) 20 tablet 0   No facility-administered medications prior to visit.   Review of Systems  Review of Systems  Constitutional: Negative.   HENT: Negative.    Respiratory:  Positive for cough, chest tightness and shortness of breath.   Cardiovascular: Negative.    Physical Exam  BP 137/83   Pulse 69   Temp (!) 97.4 F (36.3 C)   Ht 5' 6 (1.676 m)   Wt 194 lb 6.4 oz (88.2 kg)   SpO2 96% Comment: ra  BMI 31.38 kg/m  Physical Exam Constitutional:       Appearance: Normal appearance. He is well-developed.  HENT:     Head: Normocephalic and atraumatic.     Mouth/Throat:     Mouth: Mucous membranes are moist.     Pharynx: Oropharynx is clear.  Cardiovascular:     Rate and Rhythm: Normal rate and regular rhythm.     Heart sounds: Normal heart sounds.  Pulmonary:     Effort: Pulmonary effort is normal. No respiratory distress.     Breath sounds: Normal breath sounds. No wheezing or rhonchi.     Comments: Reactive cough  Musculoskeletal:        General: Normal range of motion.     Cervical back: Normal range of motion and neck supple.  Skin:    General: Skin is warm and dry.     Findings: No erythema or rash.  Neurological:  General: No focal deficit present.     Mental Status: He is alert and oriented to person, place, and time. Mental status is at baseline.  Psychiatric:        Mood and Affect: Mood normal.        Behavior: Behavior normal.        Thought Content: Thought content normal.        Judgment: Judgment normal.      Lab Results:  CBC    Component Value Date/Time   WBC 7.0 12/19/2019 1938   RBC 5.03 12/19/2019 1938   HGB 15.5 12/19/2019 1938   HCT 45.5 12/19/2019 1938   PLT 193 12/19/2019 1938   MCV 90.5 12/19/2019 1938   MCH 30.8 12/19/2019 1938   MCHC 34.1 12/19/2019 1938   RDW 12.8 12/19/2019 1938   LYMPHSABS 2.7 01/27/2017 1357   MONOABS 0.6 01/27/2017 1357   EOSABS 0.1 01/27/2017 1357   BASOSABS 0.0 01/27/2017 1357    BMET    Component Value Date/Time   NA 140 12/19/2019 1938   K 4.1 12/19/2019 1938   CL 106 12/19/2019 1938   CO2 25 12/19/2019 1938   GLUCOSE 114 (H) 12/19/2019 1938   BUN 20 12/19/2019 1938   CREATININE 1.20 12/19/2019 1938   CALCIUM 8.9 12/19/2019 1938   GFRNONAA >60 12/19/2019 1938   GFRAA >60 05/03/2018 2319    BNP No results found for: BNP  ProBNP No results found for: PROBNP  Imaging: No results found.   Assessment & Plan:   1. Mild intermittent  asthmatic bronchitis with acute exacerbation (Primary) - DG Chest 2 View; Future  2. Gastroesophageal reflux disease, unspecified whether esophagitis present   Assessment and Plan Assessment & Plan Asthmatic bronchitis with acute exacerbation Asthma exacerbation with shortness of breath and dry cough over the past two to three weeks, worsened by thyroid medication, which was discontinued. Breo was not tolerated due to dizziness, and Arnuity was effective but inconsistently used. Prednisone  was effective in the past for similar symptoms. - Refill Arnuity 100mcg and instruct to use daily. - Prescribe prednisone  taper for acute exacerbation. - Order chest x-ray to rule out pneumonia, pulmonary edema, or pneumothorax. - Instruct to use albuterol  inhaler for symptoms above baseline, two puffs every six hours. - Recommend Delsym or dextromethorphan for cough suppression. - Advise to inform primary care physician about Synthroid causing shortness of breath.  Gastroesophageal reflux disease (GERD) Belching relieves shortness of breath, suggesting GERD may contribute to respiratory symptoms. Omeprazole  was previously beneficial. - Refill omeprazole  and instruct to take in the morning before breakfast for four to six weeks.   Almarie LELON Ferrari, NP 10/08/2023

## 2023-10-08 NOTE — Addendum Note (Signed)
 Addended by: HOPE ALMARIE ORN on: 10/08/2023 10:03 AM   Modules accepted: Orders

## 2023-10-08 NOTE — Telephone Encounter (Signed)
 Patient was seen by Landry Ferrari NP this am at 8:30 am.  Nothing further needed.

## 2023-10-08 NOTE — Patient Instructions (Signed)
  VISIT SUMMARY: Today, you were seen for shortness of breath and a dry cough that have been ongoing for the past two to three weeks. These symptoms started after you began a thyroid medication, which you have since stopped. You have a history of asthma and have not been using your inhalers consistently. You also have a history of reflux, which you manage with omeprazole .  YOUR PLAN: -ASTHMA WITH ACUTE EXACERBATION: Asthma is a condition where your airways narrow and swell, making it hard to breathe. Your asthma has worsened recently, causing shortness of breath and a dry cough. You should start using Arnuity daily and take prednisone  to help with the acute symptoms. A chest x-ray will be done to rule out other issues like pneumonia. Use your albuterol  inhaler for additional relief, and you can take Delsym or dextromethorphan to suppress your cough. Inform your primary care doctor that Synthroid caused shortness of breath.  -GASTROESOPHAGEAL REFLUX DISEASE (GERD): GERD is a condition where stomach acid frequently flows back into the tube connecting your mouth and stomach, causing discomfort. Your belching seems to relieve your shortness of breath, indicating that GERD might be contributing to your symptoms. Continue taking omeprazole  in the morning before breakfast for the next four to six weeks.  INSTRUCTIONS: Please follow up with a chest x-ray to rule out pneumonia, pulmonary edema, or pneumothorax. Inform your primary care physician about the shortness of breath caused by Synthroid. Use your albuterol  inhaler as directed and take your medications consistently.  Orders: CXR today  Rx: Prednisone  taper Arnuity Albuterol  Omeprazole    Follow-up Schedule first available in 1-3 months with Dr. Pressley for routine follow-up

## 2023-10-11 ENCOUNTER — Ambulatory Visit: Payer: Self-pay | Admitting: Primary Care

## 2023-10-17 ENCOUNTER — Other Ambulatory Visit (HOSPITAL_COMMUNITY): Payer: Self-pay

## 2023-10-17 NOTE — Telephone Encounter (Signed)
What alternatives are covered?

## 2023-10-17 NOTE — Telephone Encounter (Signed)
 Inhaler not covered,please advise

## 2023-10-29 NOTE — Progress Notes (Signed)
 I called and spoke to pt. Pt informed of BW note and verbalized understanding. NFN

## 2023-10-30 ENCOUNTER — Other Ambulatory Visit: Payer: Self-pay | Admitting: Primary Care

## 2023-11-11 ENCOUNTER — Encounter: Payer: Self-pay | Admitting: Primary Care

## 2023-11-11 ENCOUNTER — Ambulatory Visit: Payer: Self-pay | Admitting: Internal Medicine

## 2023-11-11 ENCOUNTER — Ambulatory Visit (INDEPENDENT_AMBULATORY_CARE_PROVIDER_SITE_OTHER): Admitting: Primary Care

## 2023-11-11 VITALS — BP 126/70 | HR 72 | Temp 97.7°F | Ht 66.0 in | Wt 197.8 lb

## 2023-11-11 DIAGNOSIS — J441 Chronic obstructive pulmonary disease with (acute) exacerbation: Secondary | ICD-10-CM

## 2023-11-11 MED ORDER — ARNUITY ELLIPTA 200 MCG/ACT IN AEPB: 1.0000 | INHALATION_SPRAY | Freq: Every day | RESPIRATORY_TRACT | 3 refills | Status: AC

## 2023-11-11 MED ORDER — PREDNISONE 10 MG PO TABS: ORAL_TABLET | ORAL | 0 refills | Status: DC

## 2023-11-11 NOTE — Patient Instructions (Addendum)
  VISIT SUMMARY: Today, we discussed your recent increase in coughing and shortness of breath, particularly at night and in the morning. We reviewed your current medications and made some adjustments to help manage your symptoms better.  YOUR PLAN: -ASTHMA WITH RECENT EXACERBATION AND ASSOCIATED COUGH: Asthma is a condition where your airways narrow and swell, producing extra mucus, which can make breathing difficult. Your recent symptoms suggest an exacerbation, meaning your asthma has worsened temporarily. We have increased your Arnuity inhaler to a higher dose, one puff daily, and prescribed prednisone  20 mg daily for one week to help reduce inflammation. Additionally, we recommend taking Mucinex tablets, one in the morning and one in the evening for one week, to help clear mucus. Please call and report your symptoms in one to two weeks. If your symptoms persist, we will consider an alternative inhaler.  INSTRUCTIONS: Please follow the new medication plan: increase your Arnuity inhaler to a higher dose, one puff daily, take prednisone  20 mg daily for one week, and take Mucinex tablets, one in the morning and one in the evening for one week. Call and report your symptoms in one to two weeks. If your symptoms persist, we will consider an alternative inhaler.  Follow-up Please schedule a follow-up in 8-12 weeks with Dr. Geronimo

## 2023-11-11 NOTE — Telephone Encounter (Signed)
 Pt is currently being seen by Almarie Ferrari, NP for his scheduled appt. NFN

## 2023-11-11 NOTE — Telephone Encounter (Signed)
 FYI Only or Action Required?: Action required by provider: request for appointment.  Patient is followed in Pulmonology for Asthmatic Bronchitis, last seen on 10/08/2023 by Hope Almarie ORN, NP.  Called Nurse Triage reporting Shortness of Breath.   Triage Disposition: No disposition on file.  Patient/caregiver understands and will follow disposition?:   Patient disconnected before warm transfer.

## 2023-11-11 NOTE — Progress Notes (Signed)
 @Patient  ID: Tyler Tanner, male    DOB: Mar 19, 1945, 78 y.o.   MRN: [id number]  Chief Complaint  Patient presents with   Shortness of Breath    Exertion- gasping for air. Pt states it lasts for a short time. X 2-3 weeks    Cough    Dry- off and on and worsening for 2-3 weeks. Denies fever and chest pain.     Referring provider: Leron Millman, NP  HPI: 78 year old male, former smoker quit 1977 (12-pack-year history).  Past medical history significant for chronic bronchitis, COPD, reactive airway disease.  Patient of Dr. Geronimo. FENO on 02/13/17 was borderline elevated at 38ppb, this is indeterminate for eosinophilic asthma.  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  This was followed up with a CT scan of his chest which showed the same. He was unable to tolerate Breo in the past due to dizziness.  This was changed to Arnuity which he felt worked well for him.  Previous [facility] pulmonary encounter:  07/14/2019 Patient presents today for an acute office visit with reports of shortness of breath and chest congestion.  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 His cough worsened 1 month ago. He is getting up clear mucus. He stopped using Arnutiy Ellipta inhaler d/t feeling better. He has not been on steroid inhaler for the last year. He uses his rescue inhaler once a week on average. He has associated chest tightness, nasal congestion/pressure which started 4-5 days ago. He has seasonal allergies. He takes mucinex for cough and flonase  nasal as needed.     10/08/2023 Discussed the use of AI scribe software for clinical note transcription with the patient, who gave verbal consent to proceed.  History of Present Illness Tyler Tanner is a 78 year old male with asthma who presents with shortness of breath and cough.  He has been experiencing shortness of breath and a dry cough, particularly at night and in the morning,  which started gradually about two to three weeks ago. He associates the onset of these symptoms with the initiation of a thyroid medication, which he has since discontinued. Despite stopping the medication, the symptoms have persisted.  He has a history of asthma and was previously on Breo, which he could not tolerate due to dizziness, and was switched to Arnuity. He has not been using his inhalers consistently, only using them as needed in the past two to three weeks. He describes his cough as sounding like 'croup.'  He had a chest x-ray approximately four to five months ago. Prednisone  was previously effective in alleviating his cough.  His current medications include Arnuity, which he has not been taking regularly, and amlodipine for blood pressure, which he takes half a tablet of, sometimes not at all. He also mentions having stopped a cholesterol medication, possibly fenofibrate, after two to three months of use.  He quit smoking in 1977 and has a history of reflux, which he manages with omeprazole . He notes that belching sometimes relieves his shortness of breath.  No coughing up blood, fever, or chest pain, but he mentions occasional chest tightness and some relief of shortness of breath with belching.     11/11/2023 Discussed the use of AI scribe software for clinical note transcription with the patient, who gave verbal consent to proceed.  History of Present Illness Tyler Tanner is a 78 year old male with asthma who presents with increased cough and shortness  of breath.  He has experienced increased shortness of breath and cough over the past month. The cough is more frequent and often occurs when lying down to sleep or upon waking around 5 AM. He describes episodes of coughing and gagging, particularly in the morning.  He uses a steroid inhaler, Arnuity, at a medium dose once daily. A previous prednisone  taper alleviated the cough but caused increased hunger. He mentions that  prednisone  'makes me eat all the time' but 'stops me from coughing'.  A chest x-ray was performed last month. He occasionally produces clear mucus. He uses a rescue inhaler, which he finds helpful, and occasionally takes cough drops. He has not been using Mucinex or Robitussin.  He has previously tried Breo, which contains three different medications, but did not tolerate it well due to dizziness.  No fever and reports occasional clear mucus production. He experiences chest tightness due to frequent coughing.  Pulmonary function testing: PFTs 04/2017 >> mixed restriction/obstruction with significant bronchodilator response.  Decreased diffusion capacity.  No Known Allergies  Immunization History  Administered Date(s) Administered   Pneumococcal Conjugate-13 05/24/2015   Pneumococcal Polysaccharide-23 07/25/2017   Td 04/01/2007    Past Medical History:  Diagnosis Date   Asthma    GERD (gastroesophageal reflux disease)    Recurrent upper respiratory infection (URI)     Tobacco History: Social History   Tobacco Use  Smoking Status Former   Current packs/day: 0.00   Average packs/day: 1 pack/day for 12.0 years (12.0 ttl pk-yrs)   Types: Cigarettes   Start date: 07/25/1963   Quit date: 07/25/1975   Years since quitting: 48.3  Smokeless Tobacco Never   Counseling given: Not Answered   Outpatient Medications Prior to Visit  Medication Sig Dispense Refill   amLODipine (NORVASC) 10 MG tablet Take 10 mg by mouth daily.     famotidine  (PEPCID ) 40 MG tablet TAKE 1 TABLET BY MOUTH TWICE A DAY 180 tablet 0   fluticasone  (FLONASE ) 50 MCG/ACT nasal spray Place 2 sprays into both nostrils daily. 16 g 6   Fluticasone  Furoate Ellipta 100 MCG/ACT AEPB TAKE 1 PUFF BY MOUTH EVERY DAY 30 each 5   omeprazole  (PRILOSEC) 20 MG capsule TAKE 1 CAPSULE EVERY MORNING AT LEAST 30 MINUTES BEFORE FIRST DOSE OF CARAFATE . 90 capsule 1   predniSONE  (DELTASONE ) 10 MG tablet Take 4 tabs po daily x 2 days;  then 3 tabs for 2 days; then 2 tabs for 2 days; then 1 tab for 2 days 20 tablet 0   PROAIR  HFA 108 (90 Base) MCG/ACT inhaler Inhale 2 puffs into the lungs every 6 (six) hours as needed. 1 each 5   sodium chloride  (OCEAN) 0.65 % SOLN nasal spray Place 1 spray into both nostrils as needed for congestion. 60 mL 1   sucralfate  (CARAFATE ) 1 g tablet Take 1 tablet (1 g total) by mouth 4 (four) times daily -  with meals and at bedtime. 40 tablet 0   tamsulosin  (FLOMAX ) 0.4 MG CAPS capsule Take 1 capsule (0.4 mg total) by mouth daily after supper. 14 capsule 0   No facility-administered medications prior to visit.      Review of Systems  Review of Systems  Respiratory:  Positive for cough and shortness of breath.      Physical Exam  BP 126/70   Pulse 72   Temp 97.7 F (36.5 C)   Ht 5' 6 (1.676 m)   Wt 197 lb 12.8 oz (89.7 kg)   SpO2  98% Comment: ra  BMI 31.93 kg/m  Physical Exam Constitutional:      Appearance: He is well-developed.  HENT:     Head: Normocephalic and atraumatic.  Cardiovascular:     Rate and Rhythm: Normal rate and regular rhythm.  Pulmonary:     Effort: Pulmonary effort is normal. No respiratory distress.     Breath sounds: Normal breath sounds. No wheezing or rhonchi.  Musculoskeletal:        General: Normal range of motion.  Skin:    General: Skin is warm and dry.  Neurological:     General: No focal deficit present.     Mental Status: He is alert. He is disoriented.  Psychiatric:        Mood and Affect: Mood normal.        Behavior: Behavior normal.      Lab Results:  CBC    Component Value Date/Time   WBC 7.0 12/19/2019 1938   RBC 5.03 12/19/2019 1938   HGB 15.5 12/19/2019 1938   HCT 45.5 12/19/2019 1938   PLT 193 12/19/2019 1938   MCV 90.5 12/19/2019 1938   MCH 30.8 12/19/2019 1938   MCHC 34.1 12/19/2019 1938   RDW 12.8 12/19/2019 1938   LYMPHSABS 2.7 01/27/2017 1357   MONOABS 0.6 01/27/2017 1357   EOSABS 0.1 01/27/2017 1357    BASOSABS 0.0 01/27/2017 1357    BMET    Component Value Date/Time   NA 140 12/19/2019 1938   K 4.1 12/19/2019 1938   CL 106 12/19/2019 1938   CO2 25 12/19/2019 1938   GLUCOSE 114 (H) 12/19/2019 1938   BUN 20 12/19/2019 1938   CREATININE 1.20 12/19/2019 1938   CALCIUM 8.9 12/19/2019 1938   GFRNONAA >60 12/19/2019 1938   GFRAA >60 05/03/2018 2319    BNP No results found for: BNP  ProBNP No results found for: PROBNP  Imaging: No results found.   Assessment & Plan:   1. Chronic asthmatic bronchitis with acute exacerbation (HCC) (Primary)  Assessment and Plan Assessment & Plan Chronic asthmatic bronchitis with acute exacerbation  Recent exacerbation of asthma with increased coughing, particularly at night and in the morning. No signs of infection or pneumonia. Previous chest x-ray in October 2025 was clear. Current treatment includes low dose Arnuity inhaler. Responds well to prednisone  but causes increased appetite. Rescue inhaler is effective. Previous intolerance to Breo due to dizziness. - Increased Arnuity inhaler to higher dose,267mcg one puff daily. - Prescribed prednisone  20 mg daily x 5 days; then taper 10mg  x 5 days; then stop  - Recommended Mucinex tablets, one tablet morning and evening for one week. - Instructed to call and report symptoms in one to two weeks. - Will consider alternative inhaler if symptoms persist.   Almarie LELON Ferrari, NP 11/11/2023

## 2023-11-11 NOTE — Telephone Encounter (Signed)
 FYI Only or Action Required?: FYI only for provider: appointment scheduled on 11/11/23.  Patient is followed in Pulmonology for Bronchitis, last seen on 10/08/2023 by Hope Almarie ORN, NP.  Called Nurse Triage reporting Shortness of Breath.  Symptoms began a week ago.  Interventions attempted: Prescription medications: Maintenance inhaler, Albuterol  rescue once daily.  Symptoms are: gradually worsening.  Triage Disposition: See HCP Within 4 Hours (Or PCP Triage)  Patient/caregiver understands and will follow disposition?: Yes    E2C2 Pulmonary Triage - Initial Assessment Questions "Chief Complaint (e.g., cough, sob, wheezing, fever, chills, sweat or additional symptoms) *Go to specific symptom protocol after initial questions.  Cough with clear sputum  "How long have symptoms been present?" Increasing in frequency x 1 week  Have you tested for COVID or Flu? Note: If not, ask patient if a home test can be taken. If so, instruct patient to call back for positive results. No  MEDICINES:   "Have you used any OTC meds to help with symptoms?" No If yes, ask "What medications?" None  "Have you used your inhalers/maintenance medication?" Yes If yes, "What medications?" Ellipta once daily  If inhaler, ask "How many puffs and how often?" Note: Review instructions on medication in the chart. 1 daily  OXYGEN: "Do you wear supplemental oxygen?" Yes If yes, "How many liters are you supposed to use?" NA  "Do you monitor your oxygen levels?" No If yes, What is your reading (oxygen level) today? NA  What is your usual oxygen saturation reading?  (Note: Pulmonary O2 sats should be 90% or greater) NA  Reason for Disposition  [1] MILD difficulty breathing (e.g., minimal/no SOB at rest, SOB with walking, pulse < 100) AND [2] NEW-onset or WORSE than normal  Answer Assessment - Initial Assessment Questions 1. RESPIRATORY STATUS: Describe your breathing? (e.g., wheezing,  shortness of breath, unable to speak, severe coughing)      Worsening cough 2. ONSET: When did this breathing problem begin?      Worsening x 1 week 3. PATTERN Does the difficult breathing come and go, or has it been constant since it started?      Intermittent 4. SEVERITY: How bad is your breathing? (e.g., mild, moderate, severe)      Mild SOB with exertion/coughing fits  6. CARDIAC HISTORY: Do you have any history of heart disease? (e.g., heart attack, angina, bypass surgery, angioplasty)      None 7. LUNG HISTORY: Do you have any history of lung disease?  (e.g., pulmonary embolus, asthma, emphysema)     Unknown 8. CAUSE: What do you think is causing the breathing problem?      Unknown 9. OTHER SYMPTOMS: Do you have any other symptoms? (e.g., chest pain, cough, dizziness, fever, runny nose)     None  Protocols used: Breathing Difficulty-A-AH

## 2023-12-13 ENCOUNTER — Telehealth: Payer: Self-pay

## 2023-12-13 NOTE — Telephone Encounter (Signed)
 Copied from CRM [id number]. Topic: Clinical - Prescription Issue >> Dec 11, 2023  1:05 PM Devaughn RAMAN wrote: Reason for CRM: Pt calling in regarding Fluticasone  Furoate (ARNUITY ELLIPTA ) 200 MCG/ACT AEPB inhaler. Pt stated it was increased at his last office visit and he needs it decreased, pt stated it is a little too strong for him and he cannot take it, and he would like for it to be lower. Pt stated he was taking 100mg  and it is now upped to 200mg .   Called and spoke to pt. Pt states that he feels the Arnuity inhaler 200mg  is too strong. He was previously on the 100mg  until his office visit on 11/11/23.  I advised pt that I would send a message to Landry Ferrari, NP to advise on his prescription change request.  Pt expressed his frustration that I was not able to send it in, and stated that he did not understand why it had to be sent to a provider. I explained to pt that we would have to send in a new prescription for the 100mg  Arnuity, which is not something I am able to do without approval from his provider. I again offered to send the request to Landry Ferrari, NP.  Pt stated: Forget it, I will just get it somewhere else. Pt hung up. NFN.

## 2023-12-31 ENCOUNTER — Ambulatory Visit (HOSPITAL_COMMUNITY)
Admission: EM | Admit: 2023-12-31 | Discharge: 2023-12-31 | Disposition: A | Discharge status: Home / Self Care | Attending: Family Medicine | Admitting: Family Medicine

## 2023-12-31 ENCOUNTER — Ambulatory Visit (HOSPITAL_BASED_OUTPATIENT_CLINIC_OR_DEPARTMENT_OTHER)

## 2023-12-31 ENCOUNTER — Ambulatory Visit: Payer: Self-pay | Admitting: Internal Medicine

## 2023-12-31 ENCOUNTER — Encounter (HOSPITAL_COMMUNITY): Payer: Self-pay

## 2023-12-31 DIAGNOSIS — J4541 Moderate persistent asthma with (acute) exacerbation: Secondary | ICD-10-CM | POA: Diagnosis not present

## 2023-12-31 MED ORDER — IPRATROPIUM-ALBUTEROL 0.5-2.5 (3) MG/3ML IN SOLN: 3.0000 mL | Freq: Four times a day (QID) | RESPIRATORY_TRACT | 0 refills | Status: AC | PRN

## 2023-12-31 MED ORDER — GUAIFENESIN-CODEINE 100-10 MG/5ML PO SOLN: 5.0000 mL | Freq: Four times a day (QID) | ORAL | 0 refills | Status: AC | PRN

## 2023-12-31 MED ORDER — PREDNISONE 20 MG PO TABS: 40.0000 mg | ORAL_TABLET | Freq: Every day | ORAL | 0 refills | Status: AC

## 2023-12-31 NOTE — Telephone Encounter (Signed)
 [email or web address] Pulmonary Triage - Initial Assessment Questions Chief Complaint (e.g., cough, sob, wheezing, fever, chills, sweat or additional symptoms) *Go to specific symptom protocol after initial questions. Breathing  How long have symptoms been present? 1 day   Have you tested for COVID or Flu? Note: If not, ask patient if a home test can be taken. If so, instruct patient to call back for positive results. No  MEDICINES:   Have you used any OTC meds to help with symptoms? No If yes, ask What medications? Albuterol  and [employer]   Have you used your inhalers/maintenance medication? Yes If yes, What medications? Albuterol  and [employer]  If inhaler, ask How many puffs and how often? Note: Review instructions on medication in the chart. [employer] - 1 puff  OXYGEN: Do you wear supplemental oxygen? No If yes, How many liters are you supposed to use? N/a  Do you monitor your oxygen levels? No If yes, What is your reading (oxygen level) today? N/a  What is your usual oxygen saturation reading?  (Note: Pulmonary O2 sats should be 90% or greater) Pt unsure  Reason for Disposition  [1] MILD difficulty breathing (e.g., minimal/no SOB at rest, SOB with walking, pulse < 100) AND [2] NEW-onset or WORSE than normal  Answer Assessment - Initial Assessment Questions Pt calling in to report that it is hard to breath, onset 12/30/23, coughing when laying down. No relief with current respiratory medications.    Pt currently taking Albuterol , Arnuity 200mg  - but would like it dropped to 100mg .  No available appts at the pt's requested pulmonology clinic. Writer advised UC. Pt is agreeable and plans to go to UC.   1. RESPIRATORY STATUS: Describe your breathing? (e.g., wheezing, shortness of breath, unable to speak, severe coughing)      SOB 2. ONSET: When did this breathing problem begin?      12/30/23 3. PATTERN Does the difficult breathing come and go, or has it been constant  since it started?      Constant  4. SEVERITY: How bad is your breathing? (e.g., mild, moderate, severe)      mild 5. RECURRENT SYMPTOM: Have you had difficulty breathing before? If Yes, ask: When was the last time? and What happened that time?      Yes  6. CARDIAC HISTORY: Do you have any history of heart disease? (e.g., heart attack, angina, bypass surgery, angioplasty)      N/a  7. LUNG HISTORY: Do you have any history of lung disease?  (e.g., pulmonary embolus, asthma, emphysema)     Yes  8. CAUSE: What do you think is causing the breathing problem?      N/a  9. OTHER SYMPTOMS: Do you have any other symptoms? (e.g., chest pain, cough, dizziness, fever, runny nose)     Denies  Protocols used: Breathing Difficulty-A-AH  Copied from CRM #[id number]. Topic: Clinical - Red Word Triage >> Dec 31, 2023  9:52 AM Rilla NOVAK wrote: [facility] that prompted transfer to Nurse Triage: Hard to breath

## 2023-12-31 NOTE — Discharge Instructions (Signed)
 Take prednisone  20 mg--2 daily for 5 days  Robitussin with codeine cough syrup--take 5 mL or 1 teaspoon every 6 hours as needed for cough.  Albuterol -ipratropium in the nebulizer every 6 hours as needed for shortness of breath or wheezing

## 2023-12-31 NOTE — ED Notes (Signed)
 Pt c/o SOB and sent here from MD. Pt speaking in complete sentences. NAD. O2 sat 97.

## 2023-12-31 NOTE — ED Provider Notes (Signed)
 " [facility]    CSN: [id number] Arrival date & time: 12/31/23  1107      History   Chief Complaint Chief Complaint  Patient presents with   Shortness of Breath    HPI Tyler Tanner is a 78 y.o. male.    Shortness of Breath  Here for shortness of breath has been bothering him off and on since last night.  He began bothering him again this morning and Arnuity and his rescue inhaler have helped.  No fever or chills and no new upper respiratory symptoms.  Maybe had a little aching yesterday but that is resolved very quickly.  No nausea vomiting or diarrhea.  IDA  He does have a history of chronic asthma.  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 Authorizing Provider  guaiFENesin-codeine 100-10 MG/5ML syrup Take 5 mLs by mouth every 6 (six) hours as needed for cough. 12/31/23  Yes Janes Colegrove, Sharlet POUR, MD  ipratropium-albuterol  (DUONEB) 0.5-2.5 (3) MG/3ML SOLN Take 3 mLs by nebulization every 6 (six) hours as needed. 12/31/23  Yes Vonna Sharlet POUR, MD  predniSONE   (DELTASONE ) 20 MG tablet Take 2 tablets (40 mg total) by mouth daily with breakfast for 5 days. 12/31/23 01/05/24 Yes Yoshiaki Kreuser K, MD  amLODipine (NORVASC) 10 MG tablet Take 10 mg by mouth daily. 06/30/19   [provider]  famotidine  (PEPCID ) 40 MG tablet TAKE 1 TABLET BY MOUTH TWICE A DAY 01/18/20   Iva Marty Saltness, MD  fluticasone  (FLONASE ) 50 MCG/ACT nasal spray Place 2 sprays into both nostrils daily. 02/07/17   Berneta Elsie Sayre, MD  Fluticasone  Furoate (ARNUITY ELLIPTA ) 200 MCG/ACT AEPB Inhale 1 puff into the lungs daily. 11/11/23   Hope Almarie ORN, NP  omeprazole  (PRILOSEC) 20 MG capsule TAKE 1 CAPSULE EVERY MORNING AT LEAST 30 MINUTES BEFORE FIRST DOSE OF CARAFATE . 10/30/23   Hope Almarie ORN, NP  PROAIR  HFA 108 (90 Base) MCG/ACT inhaler Inhale 2 puffs into the lungs every 6 (six) hours as needed. 10/08/23   Hope Almarie ORN, NP  sodium chloride  (OCEAN) 0.65 % SOLN nasal spray Place 1 spray into both nostrils as needed for congestion. 07/14/19   Hope Almarie ORN, NP  sucralfate  (CARAFATE ) 1 g tablet Take 1 tablet (1 g total) by mouth 4 (four) times daily -  with meals and at bedtime. 05/04/18   Molpus, John, MD  tamsulosin  (FLOMAX ) 0.4 MG CAPS capsule Take 1 capsule (0.4 mg total) by mouth daily after supper. 03/27/16   Angelena Smalls, MD    Family History Family History  Problem Relation Age of Onset   Diabetes Mother    Allergic rhinitis Mother    COPD Sister    Coronary artery disease Brother     Social History Social History[1]   Allergies   Patient has no known allergies.   Review of Systems Review of Systems  Respiratory:  Positive for shortness of breath.      Physical Exam Triage Vital Signs ED Triage Vitals  Encounter Vitals Group     BP 12/31/23 1229 (!) 147/86     Girls Systolic BP Percentile --      Girls Diastolic BP Percentile --      Boys Systolic BP Percentile --      Boys Diastolic BP Percentile --      Pulse Rate  12/31/23 1117 76     Resp 12/31/23 1117 20     Temp 12/31/23 1229 97.9 F (36.6 C)     Temp Source 12/31/23 1229 Oral     SpO2 12/31/23 1117 97 %     Weight --      Height --      Head Circumference --      Peak Flow --      Pain Score 12/31/23 1225 0     Pain Loc --      Pain Education --      Exclude from Growth Chart --    No data found.  Updated Vital Signs BP (!) 147/86 (BP Location: Left Arm)   Pulse 74   Temp 97.9 F (36.6 C) (Oral)   Resp 16   SpO2 94%   Visual Acuity Right Eye Distance:   Left Eye Distance:   Bilateral Distance:    Right Eye Near:   Left Eye Near:    Bilateral Near:     Physical Exam Vitals reviewed.  Constitutional:      General: He is not in acute distress.    Appearance: He is not ill-appearing, toxic-appearing or diaphoretic.  HENT:     Right Ear: Tympanic membrane and ear canal normal.     Left Ear: Tympanic membrane and ear canal normal.     Nose: Congestion present.     Mouth/Throat:     Mouth: Mucous membranes are moist.     Pharynx: No oropharyngeal exudate or posterior oropharyngeal erythema.  Eyes:     Extraocular Movements: Extraocular movements intact.     Conjunctiva/sclera: Conjunctivae normal.     Pupils: Pupils are equal, round, and reactive to light.  Cardiovascular:     Rate and Rhythm: Normal rate and regular rhythm.     Heart sounds: No murmur heard. Pulmonary:     Effort: No respiratory distress.     Breath sounds: No stridor. No wheezing, rhonchi or rales.     Comments: He is moving air well and there are no wheezes heard at the time of exam. Musculoskeletal:     Cervical back: Neck supple.  Lymphadenopathy:     Cervical: No cervical adenopathy.  Skin:    Capillary Refill: Capillary refill takes less than 2 seconds.     Coloration: Skin is not jaundiced or pale.  Neurological:     General: No focal deficit present.     Mental Status: He is alert and oriented to person, place, and time.  Psychiatric:         Behavior: Behavior normal.      UC Treatments / Results  Labs (all labs ordered are listed, but only abnormal results  are displayed) Labs Reviewed - No data to display  EKG   Radiology No results found.  Procedures that Sari still Procedures (including critical care time)  Medications Ordered in UC Medications - No data to display  Initial Impression / Assessment and Plan / UC Course  I have reviewed the triage vital signs and the nursing notes.  Pertinent labs & imaging results that were available during my care of the patient were reviewed by me and considered in my medical decision making (see chart for details).     Prednisone  is sent in for the asthma exacerbation, along with the little bit of Robitussin with codeine that he requested.  PMP is benign.  Also he noted that DuoNeb in the nebulizer has often helped him.  That is also sent in for him to use as needed Final Clinical Impressions(s) / UC Diagnoses   Final diagnoses:  Moderate persistent asthma with exacerbation     Discharge Instructions      Take prednisone  20 mg--2 daily for 5 days  Robitussin with codeine cough syrup--take 5 mL or 1 teaspoon every 6 hours as needed for cough.  Albuterol -ipratropium in the nebulizer every 6 hours as needed for shortness of breath or wheezing     ED Prescriptions     Medication Sig Dispense Auth. Provider   predniSONE  (DELTASONE ) 20 MG tablet Take 2 tablets (40 mg total) by mouth daily with breakfast for 5 days. 10 tablet Jakyah Bradby K, MD   guaiFENesin-codeine 100-10 MG/5ML syrup Take 5 mLs by mouth every 6 (six) hours as needed for cough. 120 mL Vonna Sharlet POUR, MD   ipratropium-albuterol  (DUONEB) 0.5-2.5 (3) MG/3ML SOLN Take 3 mLs by nebulization every 6 (six) hours as needed. 225 mL Vonna Sharlet POUR, MD      I have reviewed the PDMP during this encounter.    [1]  Social History Tobacco Use   Smoking status: Former    Current  packs/day: 0.00    Average packs/day: 1 pack/day for 12.0 years (12.0 ttl pk-yrs)    Types: Cigarettes    Start date: 07/25/1963    Quit date: 07/25/1975    Years since quitting: 48.4   Smokeless tobacco: Never  Vaping Use   Vaping status: Never Used  Substance Use Topics   Alcohol use: No   Drug use: No     Vonna Sharlet POUR, MD 12/31/23 1250  "

## 2023-12-31 NOTE — ED Triage Notes (Signed)
 Patient here today with c/o SOB and dizziness since yesterday. Patient has used Albuterol  and Arnuity Ellipta  with some relief.

## 2024-02-11 ENCOUNTER — Encounter: Admitting: Internal Medicine
